# Patient Record
Sex: Female | Born: 1970
Health system: Southern US, Community
[De-identification: ages and names within clinical notes are randomized; demographics above are authoritative.]

## PROBLEM LIST (undated history)

## (undated) DIAGNOSIS — Z720 Tobacco use: Secondary | ICD-10-CM

## (undated) DIAGNOSIS — I639 Cerebral infarction, unspecified: Secondary | ICD-10-CM

## (undated) DIAGNOSIS — I509 Heart failure, unspecified: Secondary | ICD-10-CM

## (undated) DIAGNOSIS — I428 Other cardiomyopathies: Secondary | ICD-10-CM

## (undated) DIAGNOSIS — I10 Essential (primary) hypertension: Secondary | ICD-10-CM

## (undated) HISTORY — PX: CARDIAC CATHETERIZATION: SHX172

## (undated) HISTORY — PX: CAROTID STENT INSERTION: SHX5766

---

## 1997-08-20 ENCOUNTER — Other Ambulatory Visit: Admission: RE | Admit: 1997-08-20 | Discharge: 1997-08-20 | Payer: Self-pay | Admitting: Family Medicine

## 2001-09-28 ENCOUNTER — Emergency Department (HOSPITAL_COMMUNITY): Admission: EM | Admit: 2001-09-28 | Discharge: 2001-09-28 | Payer: Self-pay | Admitting: Emergency Medicine

## 2002-10-20 ENCOUNTER — Emergency Department (HOSPITAL_COMMUNITY): Admission: EM | Admit: 2002-10-20 | Discharge: 2002-10-20 | Payer: Self-pay | Admitting: Emergency Medicine

## 2002-10-20 ENCOUNTER — Encounter: Payer: Self-pay | Admitting: Emergency Medicine

## 2003-09-27 ENCOUNTER — Emergency Department (HOSPITAL_COMMUNITY): Admission: EM | Admit: 2003-09-27 | Discharge: 2003-09-27 | Payer: Self-pay

## 2003-09-30 ENCOUNTER — Emergency Department (HOSPITAL_COMMUNITY): Admission: EM | Admit: 2003-09-30 | Discharge: 2003-09-30 | Payer: Self-pay | Admitting: Family Medicine

## 2004-06-23 ENCOUNTER — Emergency Department (HOSPITAL_COMMUNITY): Admission: EM | Admit: 2004-06-23 | Discharge: 2004-06-23 | Payer: Self-pay | Admitting: Emergency Medicine

## 2004-09-04 ENCOUNTER — Emergency Department (HOSPITAL_COMMUNITY): Admission: EM | Admit: 2004-09-04 | Discharge: 2004-09-04 | Payer: Self-pay | Admitting: Family Medicine

## 2004-10-16 ENCOUNTER — Emergency Department (HOSPITAL_COMMUNITY): Admission: EM | Admit: 2004-10-16 | Discharge: 2004-10-16 | Payer: Self-pay | Admitting: Emergency Medicine

## 2005-04-02 ENCOUNTER — Emergency Department (HOSPITAL_COMMUNITY): Admission: EM | Admit: 2005-04-02 | Discharge: 2005-04-02 | Payer: Self-pay | Admitting: Emergency Medicine

## 2007-05-15 ENCOUNTER — Inpatient Hospital Stay (HOSPITAL_COMMUNITY): Admission: EM | Admit: 2007-05-15 | Discharge: 2007-05-22 | Payer: Self-pay | Admitting: Emergency Medicine

## 2007-05-17 ENCOUNTER — Ambulatory Visit: Payer: Self-pay | Admitting: Cardiology

## 2007-05-17 ENCOUNTER — Encounter (INDEPENDENT_AMBULATORY_CARE_PROVIDER_SITE_OTHER): Payer: Self-pay | Admitting: Internal Medicine

## 2007-07-08 ENCOUNTER — Inpatient Hospital Stay (HOSPITAL_COMMUNITY): Admission: EM | Admit: 2007-07-08 | Discharge: 2007-07-13 | Payer: Self-pay | Admitting: Emergency Medicine

## 2007-07-09 ENCOUNTER — Ambulatory Visit: Payer: Self-pay | Admitting: *Deleted

## 2007-07-09 ENCOUNTER — Encounter (INDEPENDENT_AMBULATORY_CARE_PROVIDER_SITE_OTHER): Payer: Self-pay | Admitting: Internal Medicine

## 2008-02-11 ENCOUNTER — Emergency Department (HOSPITAL_COMMUNITY): Admission: EM | Admit: 2008-02-11 | Discharge: 2008-02-12 | Payer: Self-pay | Admitting: Emergency Medicine

## 2008-02-15 ENCOUNTER — Inpatient Hospital Stay (HOSPITAL_COMMUNITY): Admission: EM | Admit: 2008-02-15 | Discharge: 2008-02-18 | Payer: Self-pay | Admitting: *Deleted

## 2008-02-16 ENCOUNTER — Encounter (INDEPENDENT_AMBULATORY_CARE_PROVIDER_SITE_OTHER): Payer: Self-pay | Admitting: Cardiology

## 2008-02-29 ENCOUNTER — Encounter: Payer: Self-pay | Admitting: Emergency Medicine

## 2008-02-29 ENCOUNTER — Ambulatory Visit: Payer: Self-pay | Admitting: *Deleted

## 2008-03-01 ENCOUNTER — Inpatient Hospital Stay (HOSPITAL_COMMUNITY): Admission: EM | Admit: 2008-03-01 | Discharge: 2008-03-06 | Payer: Self-pay | Admitting: Cardiology

## 2008-03-23 ENCOUNTER — Inpatient Hospital Stay (HOSPITAL_COMMUNITY): Admission: EM | Admit: 2008-03-23 | Discharge: 2008-03-26 | Payer: Self-pay | Admitting: Anesthesiology

## 2008-03-23 ENCOUNTER — Ambulatory Visit: Payer: Self-pay | Admitting: Cardiology

## 2008-04-03 ENCOUNTER — Inpatient Hospital Stay (HOSPITAL_COMMUNITY): Admission: EM | Admit: 2008-04-03 | Discharge: 2008-04-08 | Payer: Self-pay | Admitting: Emergency Medicine

## 2008-04-04 ENCOUNTER — Encounter (INDEPENDENT_AMBULATORY_CARE_PROVIDER_SITE_OTHER): Payer: Self-pay | Admitting: Internal Medicine

## 2008-05-03 ENCOUNTER — Ambulatory Visit: Payer: Self-pay | Admitting: Cardiovascular Disease

## 2008-05-03 ENCOUNTER — Inpatient Hospital Stay (HOSPITAL_COMMUNITY): Admission: EM | Admit: 2008-05-03 | Discharge: 2008-05-07 | Payer: Self-pay | Admitting: Emergency Medicine

## 2008-05-03 ENCOUNTER — Encounter: Payer: Self-pay | Admitting: Neurology

## 2008-05-04 ENCOUNTER — Encounter (INDEPENDENT_AMBULATORY_CARE_PROVIDER_SITE_OTHER): Payer: Self-pay | Admitting: Neurology

## 2008-05-05 ENCOUNTER — Encounter (INDEPENDENT_AMBULATORY_CARE_PROVIDER_SITE_OTHER): Payer: Self-pay | Admitting: Neurology

## 2008-05-18 DIAGNOSIS — I6350 Cerebral infarction due to unspecified occlusion or stenosis of unspecified cerebral artery: Secondary | ICD-10-CM | POA: Insufficient documentation

## 2008-05-19 ENCOUNTER — Inpatient Hospital Stay (HOSPITAL_COMMUNITY): Admission: EM | Admit: 2008-05-19 | Discharge: 2008-05-24 | Payer: Self-pay | Admitting: Emergency Medicine

## 2008-05-19 ENCOUNTER — Ambulatory Visit: Payer: Self-pay | Admitting: Vascular Surgery

## 2008-05-19 ENCOUNTER — Encounter (INDEPENDENT_AMBULATORY_CARE_PROVIDER_SITE_OTHER): Payer: Self-pay | Admitting: Internal Medicine

## 2008-07-04 ENCOUNTER — Emergency Department (HOSPITAL_COMMUNITY): Admission: EM | Admit: 2008-07-04 | Discharge: 2008-07-04 | Payer: Self-pay | Admitting: Family Medicine

## 2008-07-20 ENCOUNTER — Ambulatory Visit: Payer: Self-pay | Admitting: Critical Care Medicine

## 2008-07-20 ENCOUNTER — Inpatient Hospital Stay (HOSPITAL_COMMUNITY): Admission: EM | Admit: 2008-07-20 | Discharge: 2008-07-24 | Payer: Self-pay | Admitting: Emergency Medicine

## 2008-09-18 ENCOUNTER — Encounter (INDEPENDENT_AMBULATORY_CARE_PROVIDER_SITE_OTHER): Payer: Self-pay | Admitting: Nurse Practitioner

## 2008-09-25 ENCOUNTER — Encounter: Payer: Self-pay | Admitting: Cardiology

## 2008-09-25 ENCOUNTER — Ambulatory Visit: Payer: Self-pay | Admitting: Nurse Practitioner

## 2008-09-25 DIAGNOSIS — G4730 Sleep apnea, unspecified: Secondary | ICD-10-CM | POA: Insufficient documentation

## 2008-09-25 DIAGNOSIS — D649 Anemia, unspecified: Secondary | ICD-10-CM | POA: Insufficient documentation

## 2008-09-25 DIAGNOSIS — I509 Heart failure, unspecified: Secondary | ICD-10-CM | POA: Insufficient documentation

## 2008-09-25 DIAGNOSIS — I10 Essential (primary) hypertension: Secondary | ICD-10-CM | POA: Insufficient documentation

## 2008-09-25 DIAGNOSIS — F17200 Nicotine dependence, unspecified, uncomplicated: Secondary | ICD-10-CM | POA: Insufficient documentation

## 2008-09-25 DIAGNOSIS — G4700 Insomnia, unspecified: Secondary | ICD-10-CM | POA: Insufficient documentation

## 2008-09-25 LAB — CONVERTED CEMR LAB
BUN: 19 mg/dL (ref 6–23)
CO2: 27 meq/L (ref 19–32)
Calcium: 10 mg/dL (ref 8.4–10.5)
Creatinine, Ser: 1.47 mg/dL — ABNORMAL HIGH (ref 0.40–1.20)
Glucose, Bld: 77 mg/dL (ref 70–99)

## 2008-09-28 ENCOUNTER — Telehealth (INDEPENDENT_AMBULATORY_CARE_PROVIDER_SITE_OTHER): Payer: Self-pay | Admitting: Nurse Practitioner

## 2008-09-29 ENCOUNTER — Ambulatory Visit: Payer: Self-pay | Admitting: Cardiology

## 2008-09-29 ENCOUNTER — Encounter (INDEPENDENT_AMBULATORY_CARE_PROVIDER_SITE_OTHER): Payer: Self-pay | Admitting: Nurse Practitioner

## 2008-09-30 ENCOUNTER — Telehealth (INDEPENDENT_AMBULATORY_CARE_PROVIDER_SITE_OTHER): Payer: Self-pay | Admitting: Nurse Practitioner

## 2008-10-12 ENCOUNTER — Encounter (INDEPENDENT_AMBULATORY_CARE_PROVIDER_SITE_OTHER): Payer: Self-pay | Admitting: *Deleted

## 2008-10-13 ENCOUNTER — Encounter (INDEPENDENT_AMBULATORY_CARE_PROVIDER_SITE_OTHER): Payer: Self-pay | Admitting: Nurse Practitioner

## 2008-10-14 ENCOUNTER — Telehealth (INDEPENDENT_AMBULATORY_CARE_PROVIDER_SITE_OTHER): Payer: Self-pay | Admitting: Nurse Practitioner

## 2008-10-15 ENCOUNTER — Telehealth: Payer: Self-pay | Admitting: Cardiology

## 2008-10-15 LAB — CONVERTED CEMR LAB

## 2008-10-16 ENCOUNTER — Encounter (INDEPENDENT_AMBULATORY_CARE_PROVIDER_SITE_OTHER): Payer: Self-pay | Admitting: *Deleted

## 2008-10-16 ENCOUNTER — Encounter (INDEPENDENT_AMBULATORY_CARE_PROVIDER_SITE_OTHER): Payer: Self-pay | Admitting: Nurse Practitioner

## 2008-10-21 ENCOUNTER — Telehealth (INDEPENDENT_AMBULATORY_CARE_PROVIDER_SITE_OTHER): Payer: Self-pay | Admitting: Nurse Practitioner

## 2008-10-23 ENCOUNTER — Encounter (INDEPENDENT_AMBULATORY_CARE_PROVIDER_SITE_OTHER): Payer: Self-pay | Admitting: Nurse Practitioner

## 2008-10-28 ENCOUNTER — Encounter (INDEPENDENT_AMBULATORY_CARE_PROVIDER_SITE_OTHER): Payer: Self-pay | Admitting: Nurse Practitioner

## 2008-10-29 ENCOUNTER — Encounter (INDEPENDENT_AMBULATORY_CARE_PROVIDER_SITE_OTHER): Payer: Self-pay | Admitting: Nurse Practitioner

## 2008-10-30 ENCOUNTER — Encounter (INDEPENDENT_AMBULATORY_CARE_PROVIDER_SITE_OTHER): Payer: Self-pay | Admitting: Nurse Practitioner

## 2008-11-04 ENCOUNTER — Inpatient Hospital Stay (HOSPITAL_COMMUNITY): Admission: EM | Admit: 2008-11-04 | Discharge: 2008-11-08 | Payer: Self-pay | Admitting: Cardiology

## 2008-11-04 ENCOUNTER — Ambulatory Visit: Payer: Self-pay | Admitting: Cardiology

## 2008-11-04 ENCOUNTER — Encounter: Payer: Self-pay | Admitting: Physician Assistant

## 2008-11-05 ENCOUNTER — Encounter (INDEPENDENT_AMBULATORY_CARE_PROVIDER_SITE_OTHER): Payer: Self-pay | Admitting: Nurse Practitioner

## 2008-11-06 ENCOUNTER — Encounter: Payer: Self-pay | Admitting: Cardiology

## 2008-11-08 DIAGNOSIS — N183 Chronic kidney disease, stage 3 unspecified: Secondary | ICD-10-CM | POA: Insufficient documentation

## 2008-11-17 ENCOUNTER — Ambulatory Visit: Payer: Self-pay | Admitting: Nurse Practitioner

## 2008-11-17 ENCOUNTER — Encounter: Payer: Self-pay | Admitting: Cardiology

## 2008-11-17 DIAGNOSIS — R300 Dysuria: Secondary | ICD-10-CM | POA: Insufficient documentation

## 2008-11-17 DIAGNOSIS — M542 Cervicalgia: Secondary | ICD-10-CM | POA: Insufficient documentation

## 2008-11-18 ENCOUNTER — Encounter (INDEPENDENT_AMBULATORY_CARE_PROVIDER_SITE_OTHER): Payer: Self-pay | Admitting: Nurse Practitioner

## 2008-11-20 LAB — CONVERTED CEMR LAB
Calcium: 9.9 mg/dL (ref 8.4–10.5)
Creatinine, Ser: 1.37 mg/dL — ABNORMAL HIGH (ref 0.40–1.20)

## 2008-12-03 ENCOUNTER — Telehealth (INDEPENDENT_AMBULATORY_CARE_PROVIDER_SITE_OTHER): Payer: Self-pay | Admitting: Nurse Practitioner

## 2008-12-07 ENCOUNTER — Encounter (INDEPENDENT_AMBULATORY_CARE_PROVIDER_SITE_OTHER): Payer: Self-pay | Admitting: Nurse Practitioner

## 2008-12-08 ENCOUNTER — Encounter (INDEPENDENT_AMBULATORY_CARE_PROVIDER_SITE_OTHER): Payer: Self-pay | Admitting: Nurse Practitioner

## 2008-12-11 ENCOUNTER — Telehealth (INDEPENDENT_AMBULATORY_CARE_PROVIDER_SITE_OTHER): Payer: Self-pay | Admitting: Nurse Practitioner

## 2008-12-25 ENCOUNTER — Telehealth (INDEPENDENT_AMBULATORY_CARE_PROVIDER_SITE_OTHER): Payer: Self-pay | Admitting: *Deleted

## 2008-12-29 ENCOUNTER — Encounter (INDEPENDENT_AMBULATORY_CARE_PROVIDER_SITE_OTHER): Payer: Self-pay | Admitting: Nurse Practitioner

## 2009-01-03 ENCOUNTER — Encounter: Payer: Self-pay | Admitting: Cardiology

## 2009-01-19 ENCOUNTER — Encounter (INDEPENDENT_AMBULATORY_CARE_PROVIDER_SITE_OTHER): Payer: Self-pay | Admitting: Nurse Practitioner

## 2009-02-02 ENCOUNTER — Encounter (INDEPENDENT_AMBULATORY_CARE_PROVIDER_SITE_OTHER): Payer: Self-pay | Admitting: Nurse Practitioner

## 2009-02-24 ENCOUNTER — Ambulatory Visit: Payer: Self-pay | Admitting: Nurse Practitioner

## 2009-02-24 DIAGNOSIS — B009 Herpesviral infection, unspecified: Secondary | ICD-10-CM | POA: Insufficient documentation

## 2009-03-08 ENCOUNTER — Encounter (INDEPENDENT_AMBULATORY_CARE_PROVIDER_SITE_OTHER): Payer: Self-pay | Admitting: *Deleted

## 2009-04-12 ENCOUNTER — Telehealth (INDEPENDENT_AMBULATORY_CARE_PROVIDER_SITE_OTHER): Payer: Self-pay | Admitting: Nurse Practitioner

## 2009-04-20 ENCOUNTER — Encounter (INDEPENDENT_AMBULATORY_CARE_PROVIDER_SITE_OTHER): Payer: Self-pay | Admitting: *Deleted

## 2009-05-03 IMAGING — CR DG CHEST 2V
2 series · 2 of 2 positions shown · non-contrast
Comparison: 03/23/2008.

CLINICAL DATA: Cough, fever, shortness of breath and chest pain.

CHEST - 2 VIEW

[w chest pa]
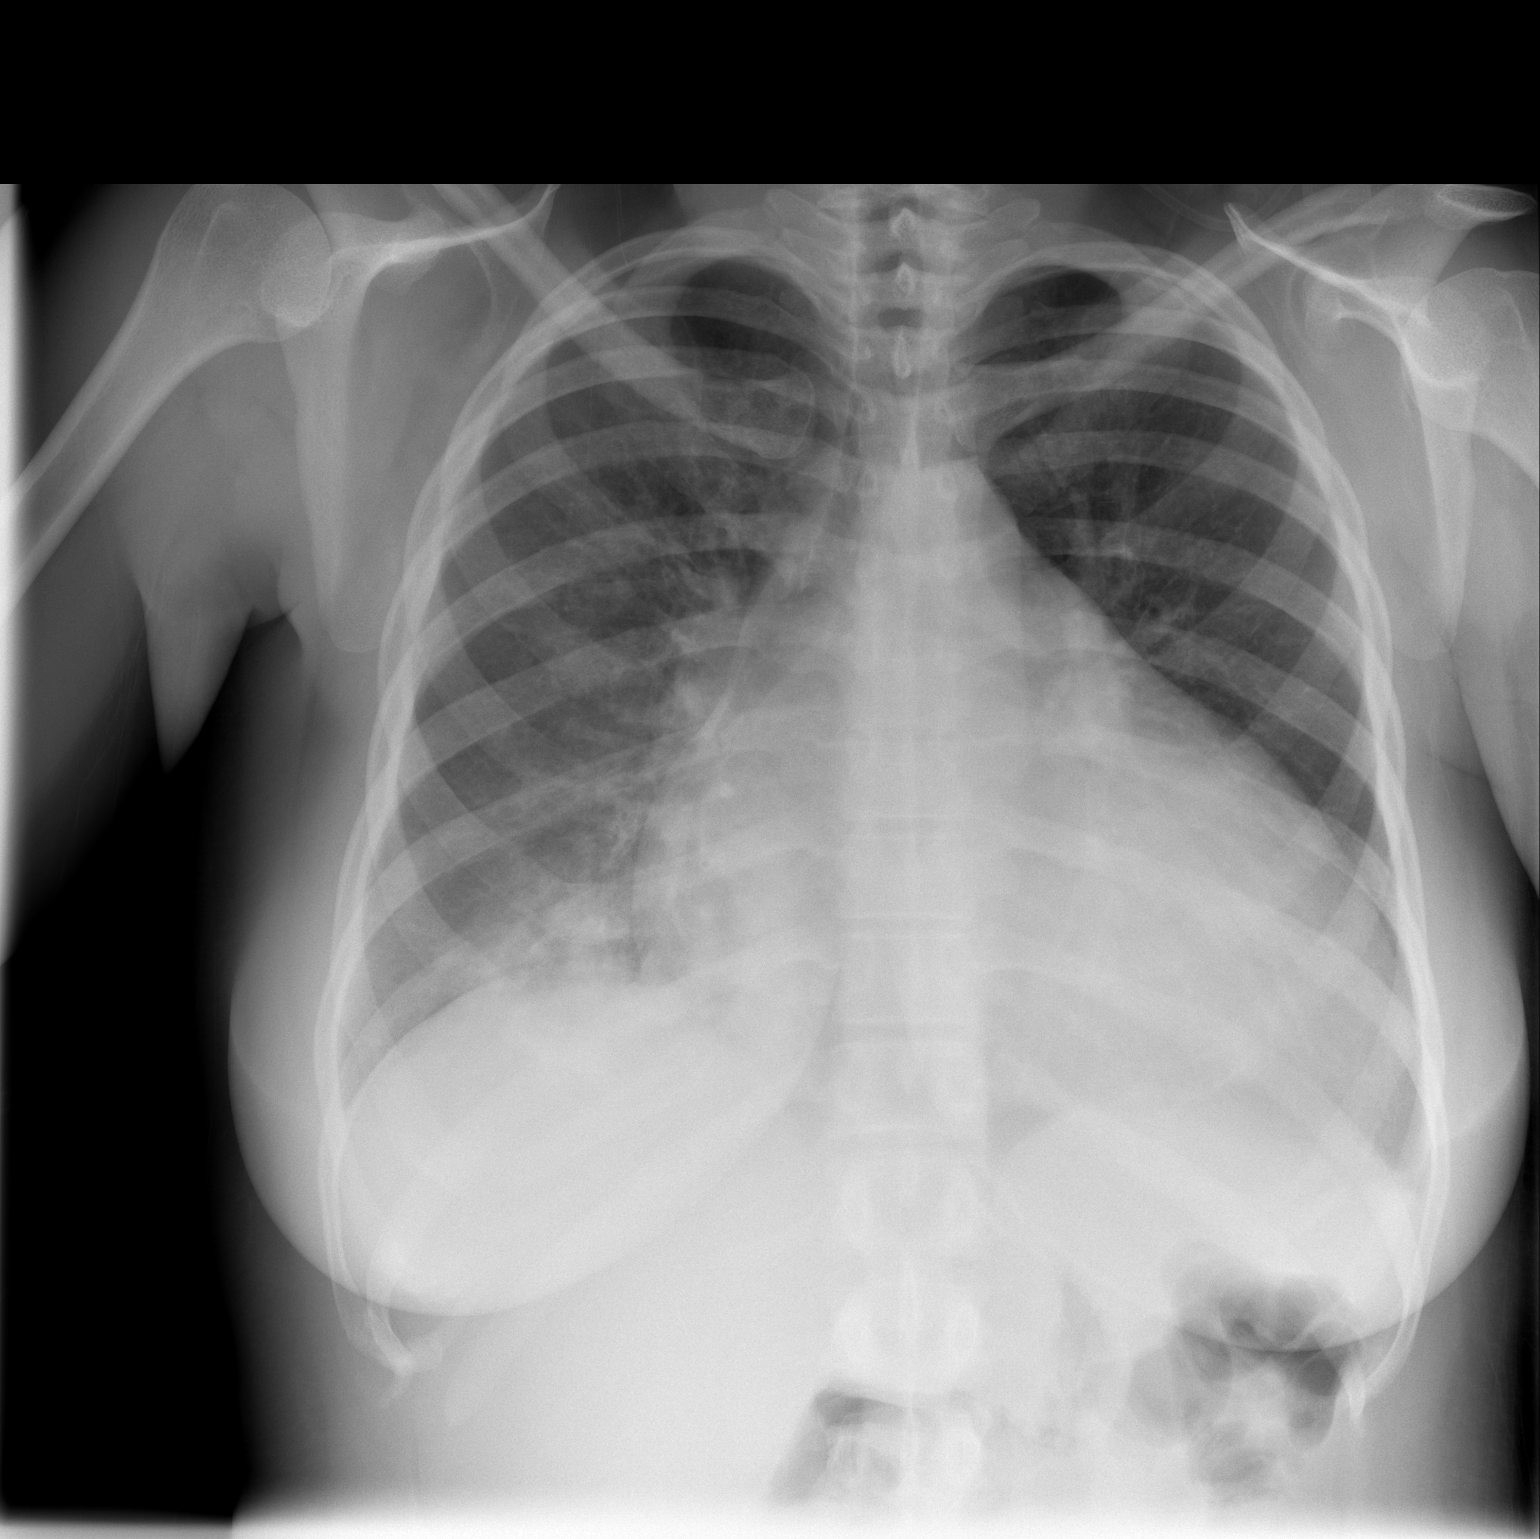

[w chest lat]
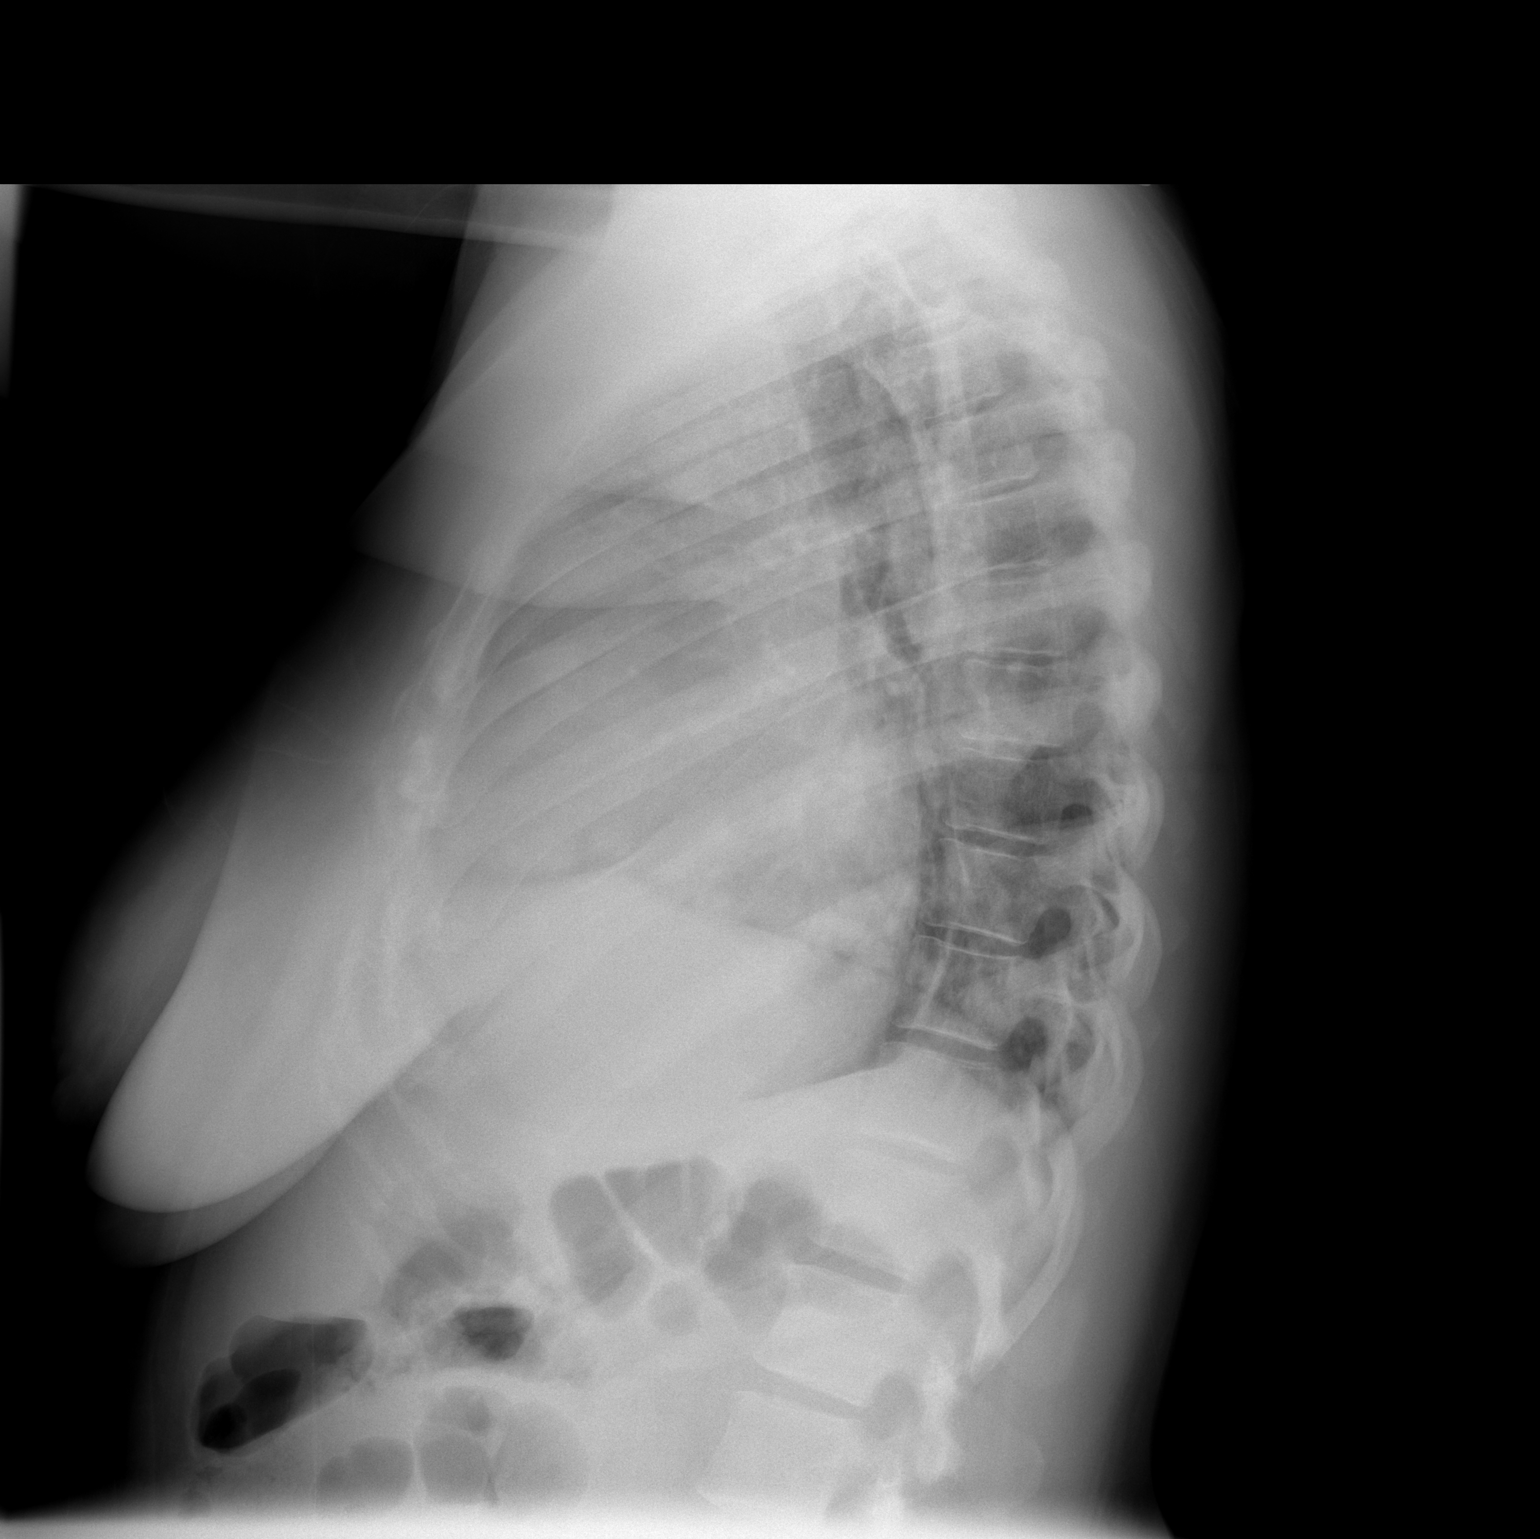

[2 of 2 positions shown; findings below may reference images not displayed]

FINDINGS: Stable enlargement of the cardiac silhouette.  Interval
patchy right lower lobe airspace opacity.  Unremarkable bones.
IMPRESSION: 1.  Right lower lobe pneumonia.
2.  Stable cardiomegaly.

## 2009-06-02 IMAGING — XA IR ANGIO/VERTEBRAL*R*
1 series · 16 of 24 positions shown · non-contrast
Comparison: CT scan of the brain of 05/03/2008.

CLINICAL DATA: Acute onset of left-sided weakness right gaze
preference

BILATERAL CAROTID ARTERIOGRAPHY, AND BILATERAL VERTEBRAL ARTERY
ANGIOGRAMS

[Series 1: run · 16 of 259 slices shown]
[im 1/259]
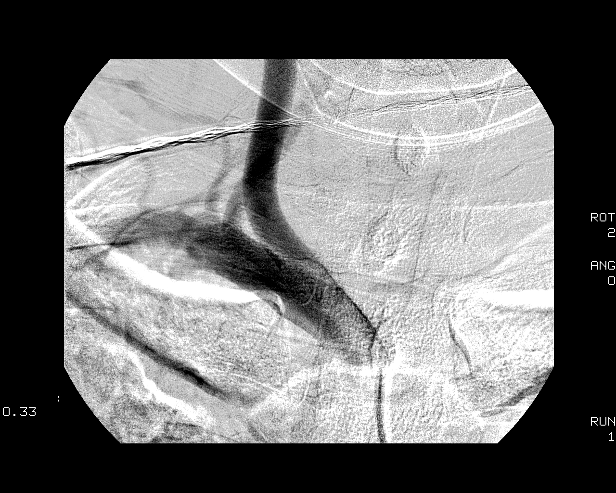
[im 23/259]
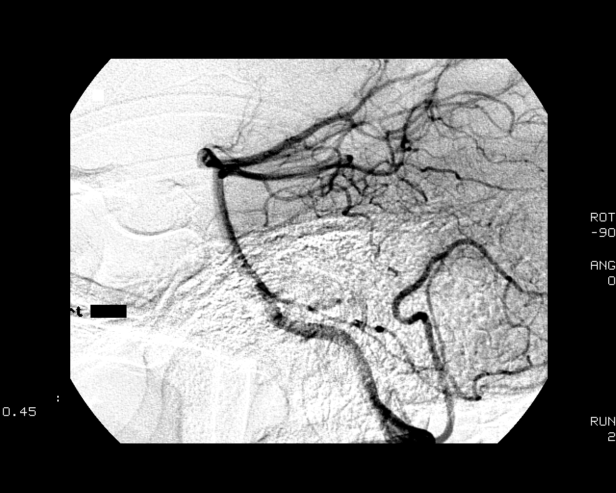
[im 34/259]
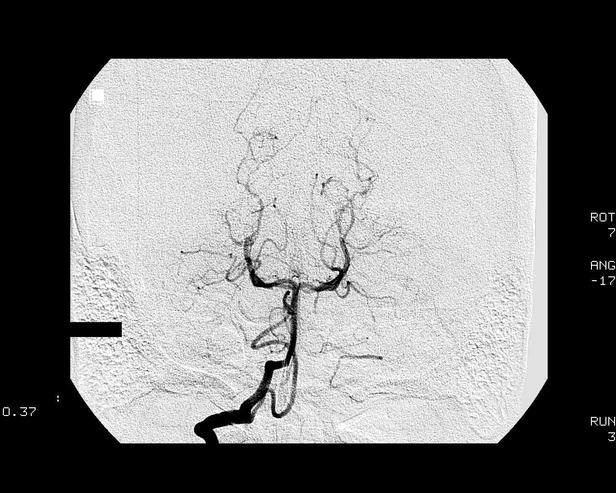
[im 57/259]
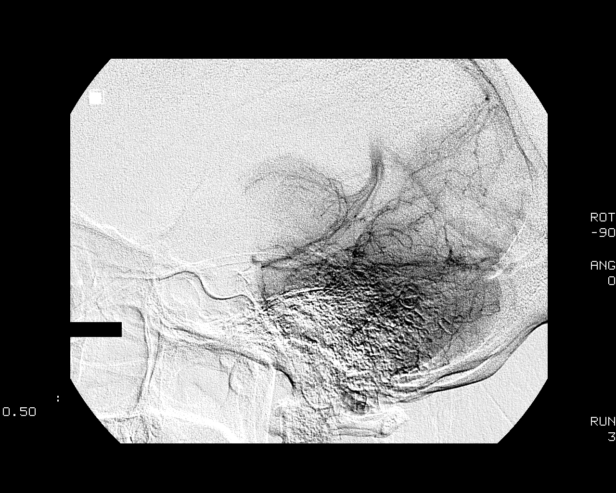
[im 68/259]
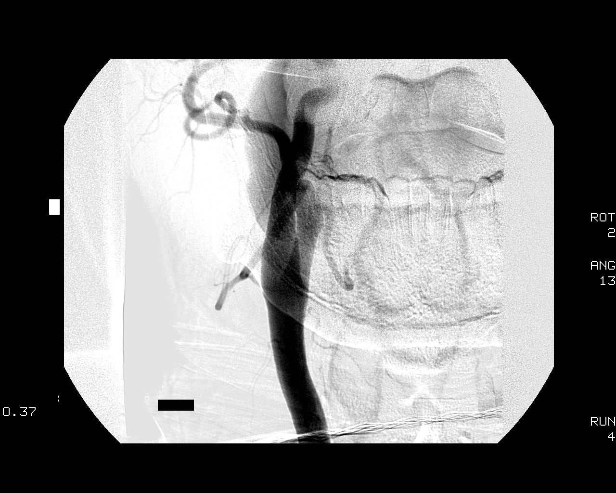
[im 90/259]
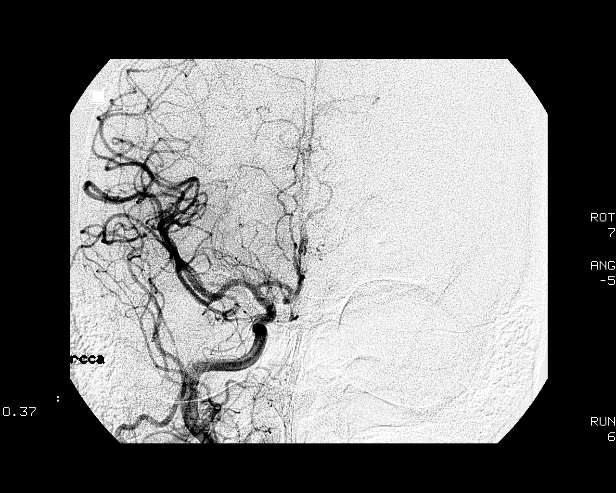
[im 101/259]
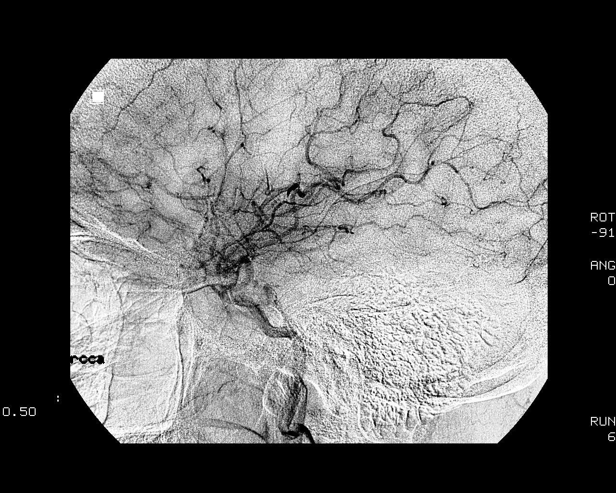
[im 124/259]
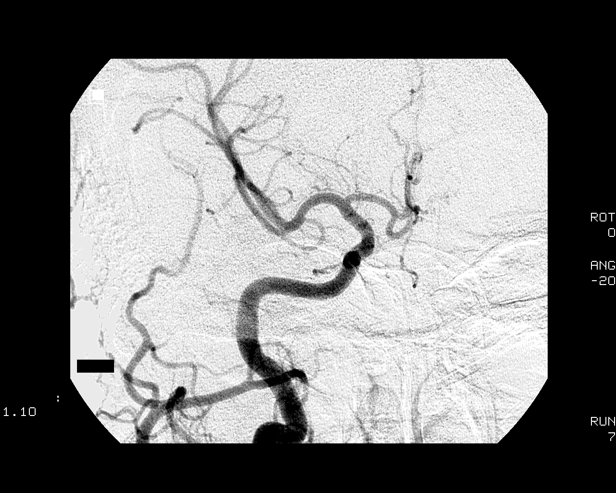
[im 135/259]
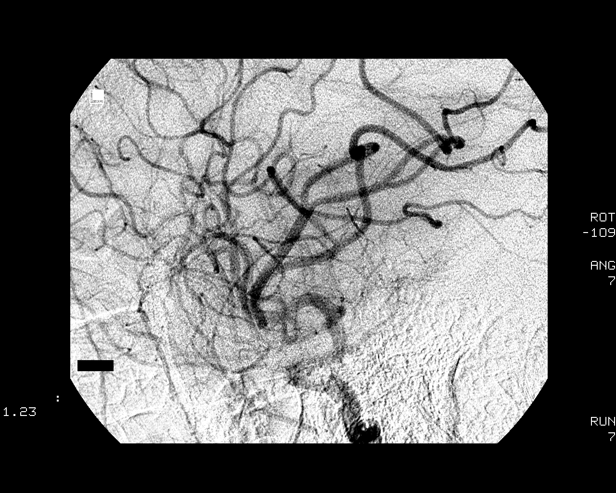
[im 158/259]
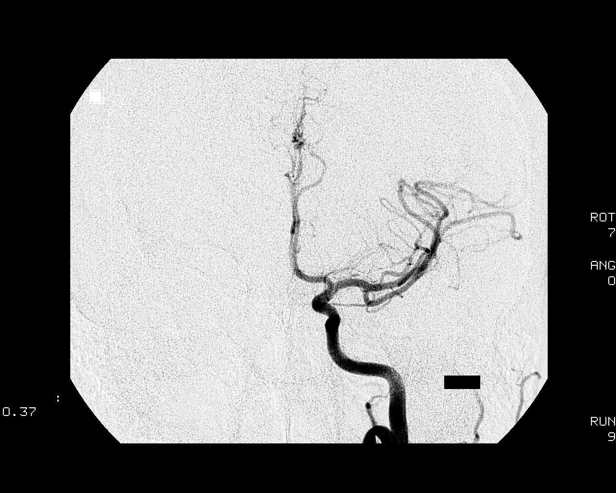
[im 169/259]
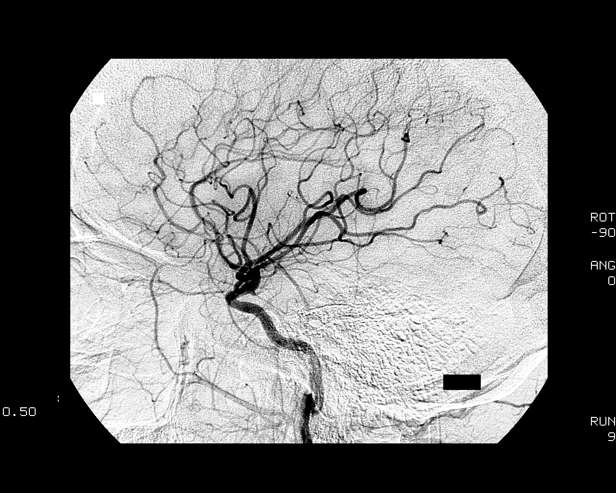
[im 191/259]
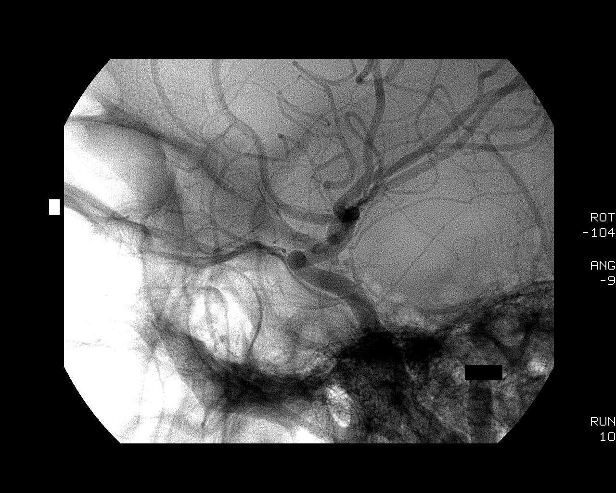
[im 202/259]
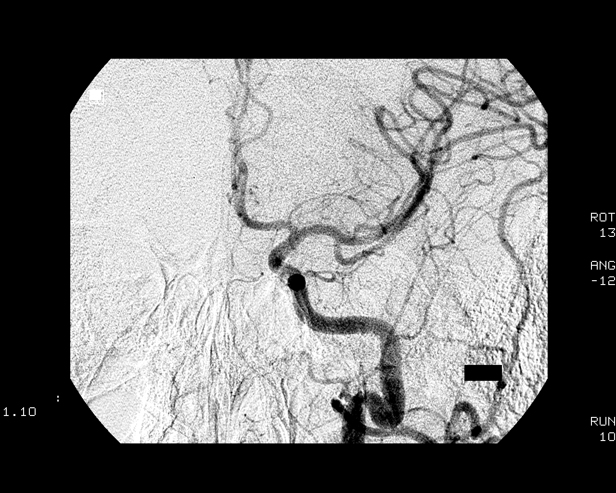
[im 225/259]
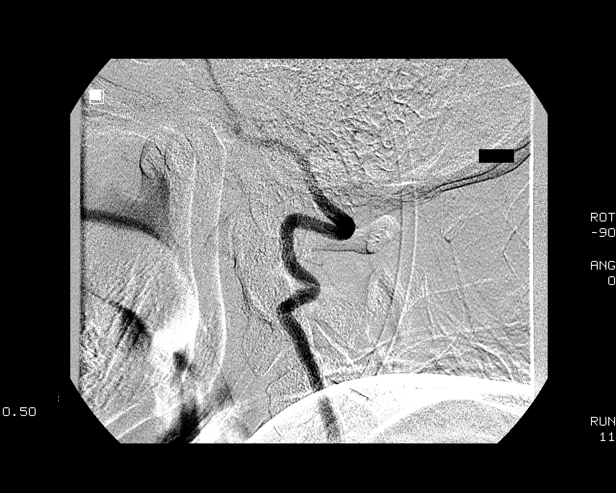
[im 236/259]
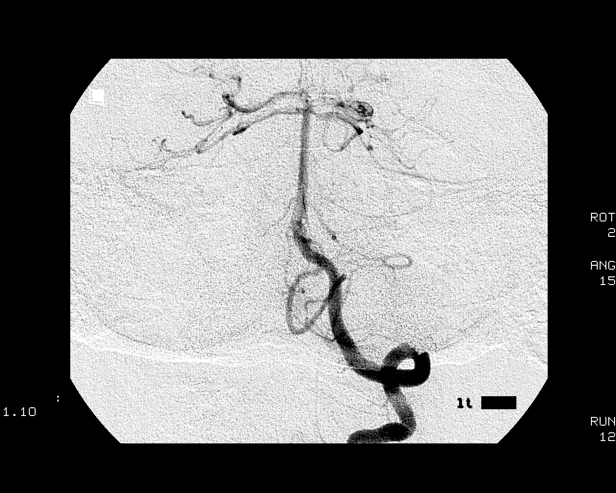
[im 259/259]
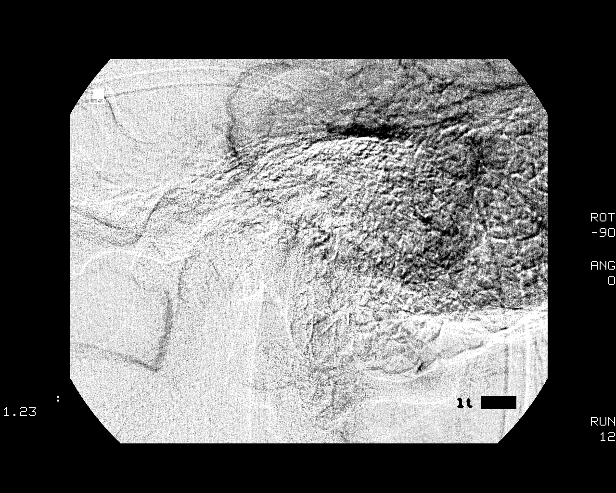

[16 of 24 positions shown; findings below may reference images not displayed]

Following a full explanation of the procedure along with the
potential associated complications, an informed witnessed consent
was obtained/

The right groin was prepped and draped in the usual sterile
fashion.  Thereafter using a modified Seldinger technique,
transfemoral access into the right common femoral artery was
obtained without difficulty.  Over a 0.035-inch guidewire, a 5-
French Pinnacle sheath was inserted.  Through this and also over a
0.035-inch guidewire, a 5-French JB1 catheter was advanced to the
aortic arch region and selectively positioned in the right
vertebral artery, the right common carotid artery, the left common
carotid artery and the left vertebral artery.

There were no acute complications.  The patient tolerated the
procedure well.

Medications:  IV Cardene.
FINDINGS: The right vertebral artery origin is normal.  The vessel
is seen to opacify normally to the cranial skull base.

Normal opacification is seen of the right posterior inferior
cerebellar artery and the right vertebrobasilar junction.

The opacified portion of the basilar artery, the posterior cerebral
arteries, the superior cerebellar arteries and the anterior
inferior cerebellar arteries is normal into the capillary and the
venous phases.

Unopacified blood is seen in the basilar artery from the
contralateral vertebral artery.

The right common carotid arteriogram demonstrates the right
external carotid artery and its major branches to be normal.

The right internal carotid artery at the bulb to the cranial skull
base is normally opacified.

The petrous, the cavernous and the supraclinoid segments are within
normal limits.

The right middle and the right anterior cerebral arteries are seen
to opacify normally proximally.  There is a truncated superior
division of the right middle cerebral artery in the M2/M3 region.

An associated small focal area of hypoperfusion is seen in the
right mid peri-insular subcortical white matter.

The subsequent capillary and the venous phases are otherwise within
normal limits.

An incidental small infundibulum is seen at the origin of the right
posterior communicating artery.

The left common carotid arteriogram demonstrates the left external
carotid artery and its major branches to be normal.

The left internal carotid artery at the bulb to the level of C1 is
normal.

Just proximal to the cranial skull base there is a focal area of
50% stenosis at the cervical-petrous junction.  Distal to this, the
vessel is seen to opacify normally in the petrous, the cavernous
and the supraclinoid segments.

Again demonstrated is a small infundibulum at the origin of the
left posterior communicating artery.

The left middle and the left anterior cerebral arteries are seen to
opacify normally into the capillary and the venous phases.  Cross
opacification via the ACOM of the right ACA distal to the A2
segment is noted.

The left vertebral artery origin is normal.  The vessel is seen to
opacify normally to the cranial skull base.

There is normal opacification of the left posterior inferior
cerebellar artery and the left vertebrobasilar junction.

The basilar artery, the opacified portion of the posterior cerebral
arteries, the superior cerebellar arteries and the anterior
inferior cerebellar arteries is normally into the capillary and the
venous phases.
IMPRESSION: 1.  Angiographic occlusion of the superior division of the right
middle cerebral artery in the M2/M3 region.
2.  50% stenosis of the left internal carotid artery at the
cervical-petrous junction.

## 2009-06-02 IMAGING — CR DG CHEST 2V
2 series · 2 of 2 positions shown · non-contrast
Comparison: 04/05/2008.

CLINICAL DATA: Shortness of breath and anxiety.

CHEST - 2 VIEW

[w chest pa]
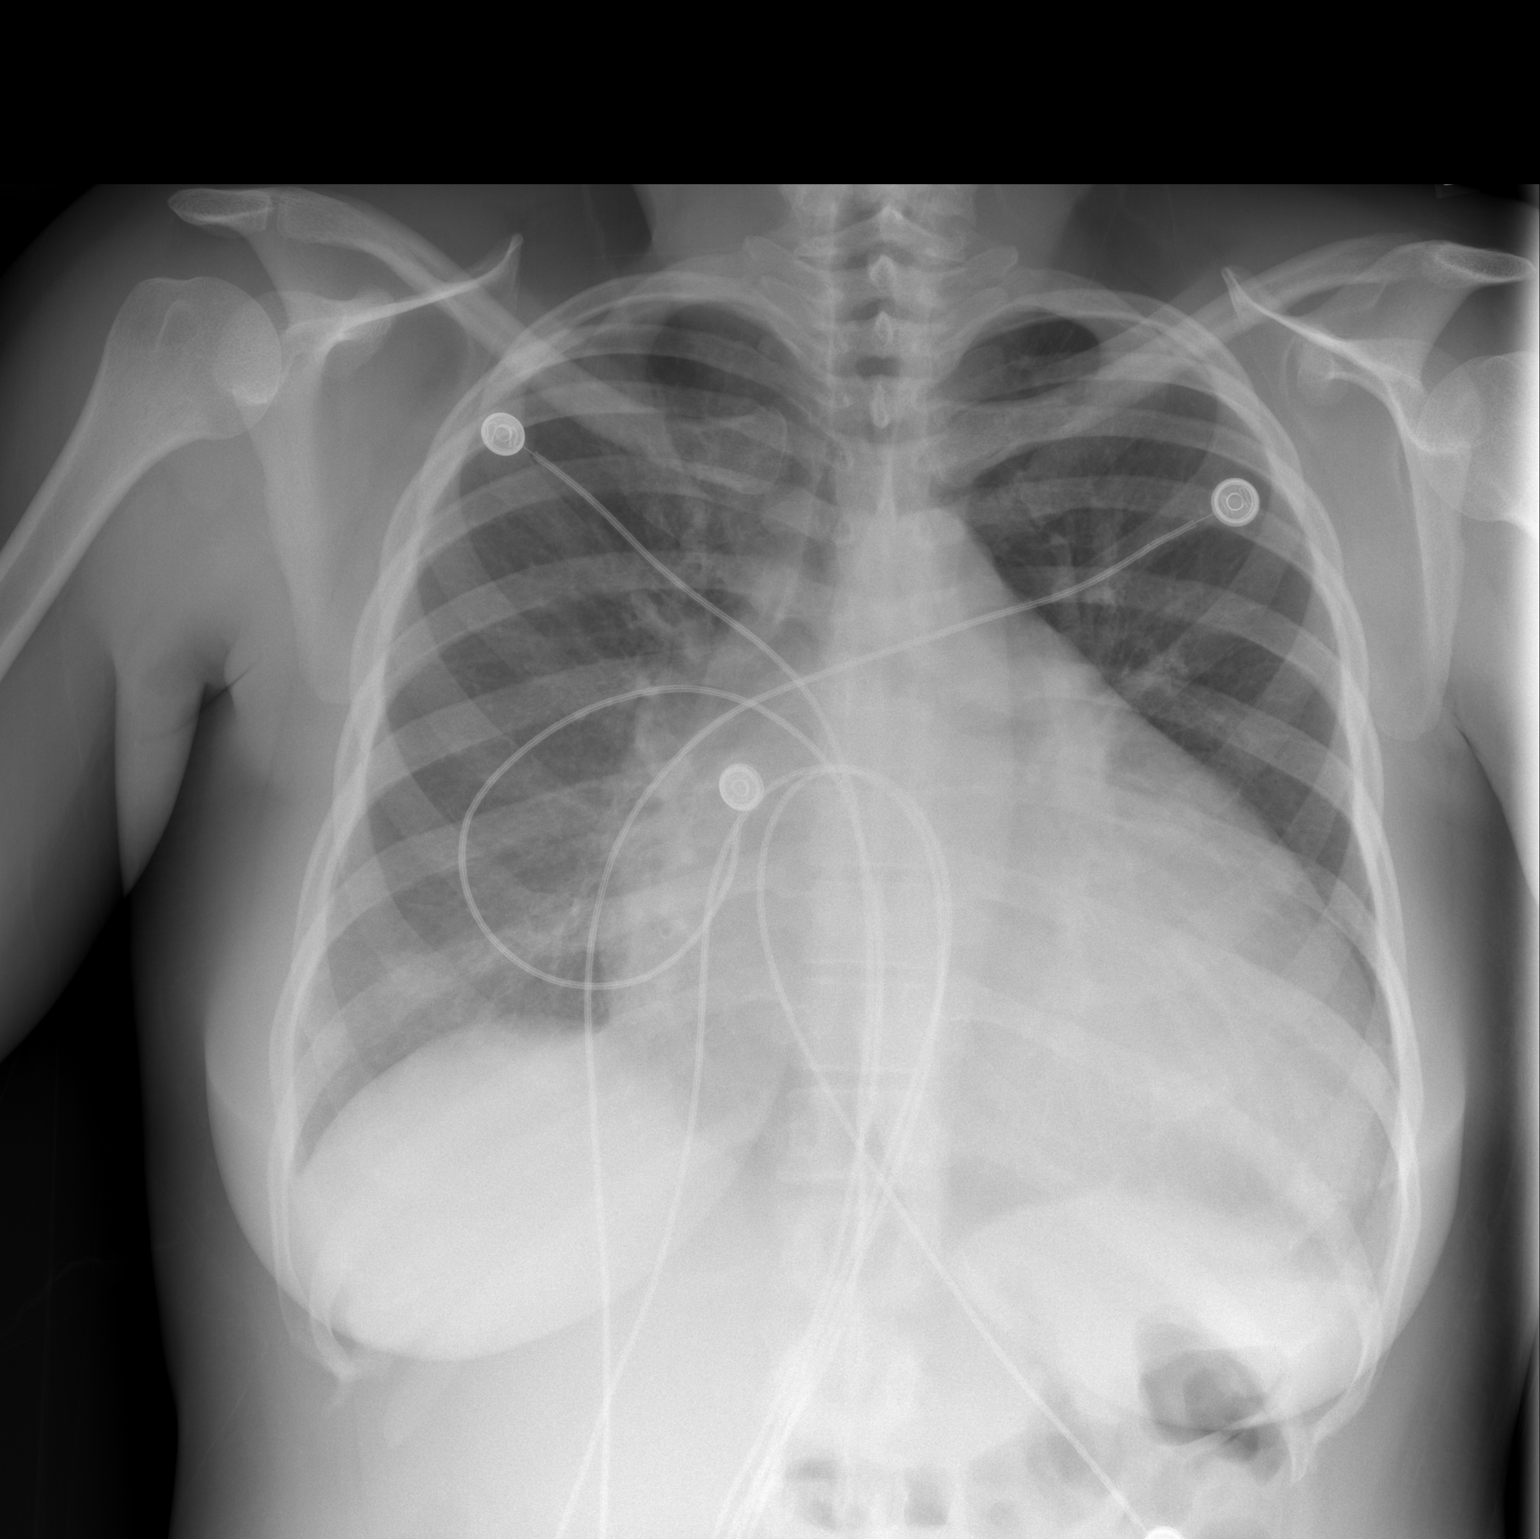

[w chest lat *]
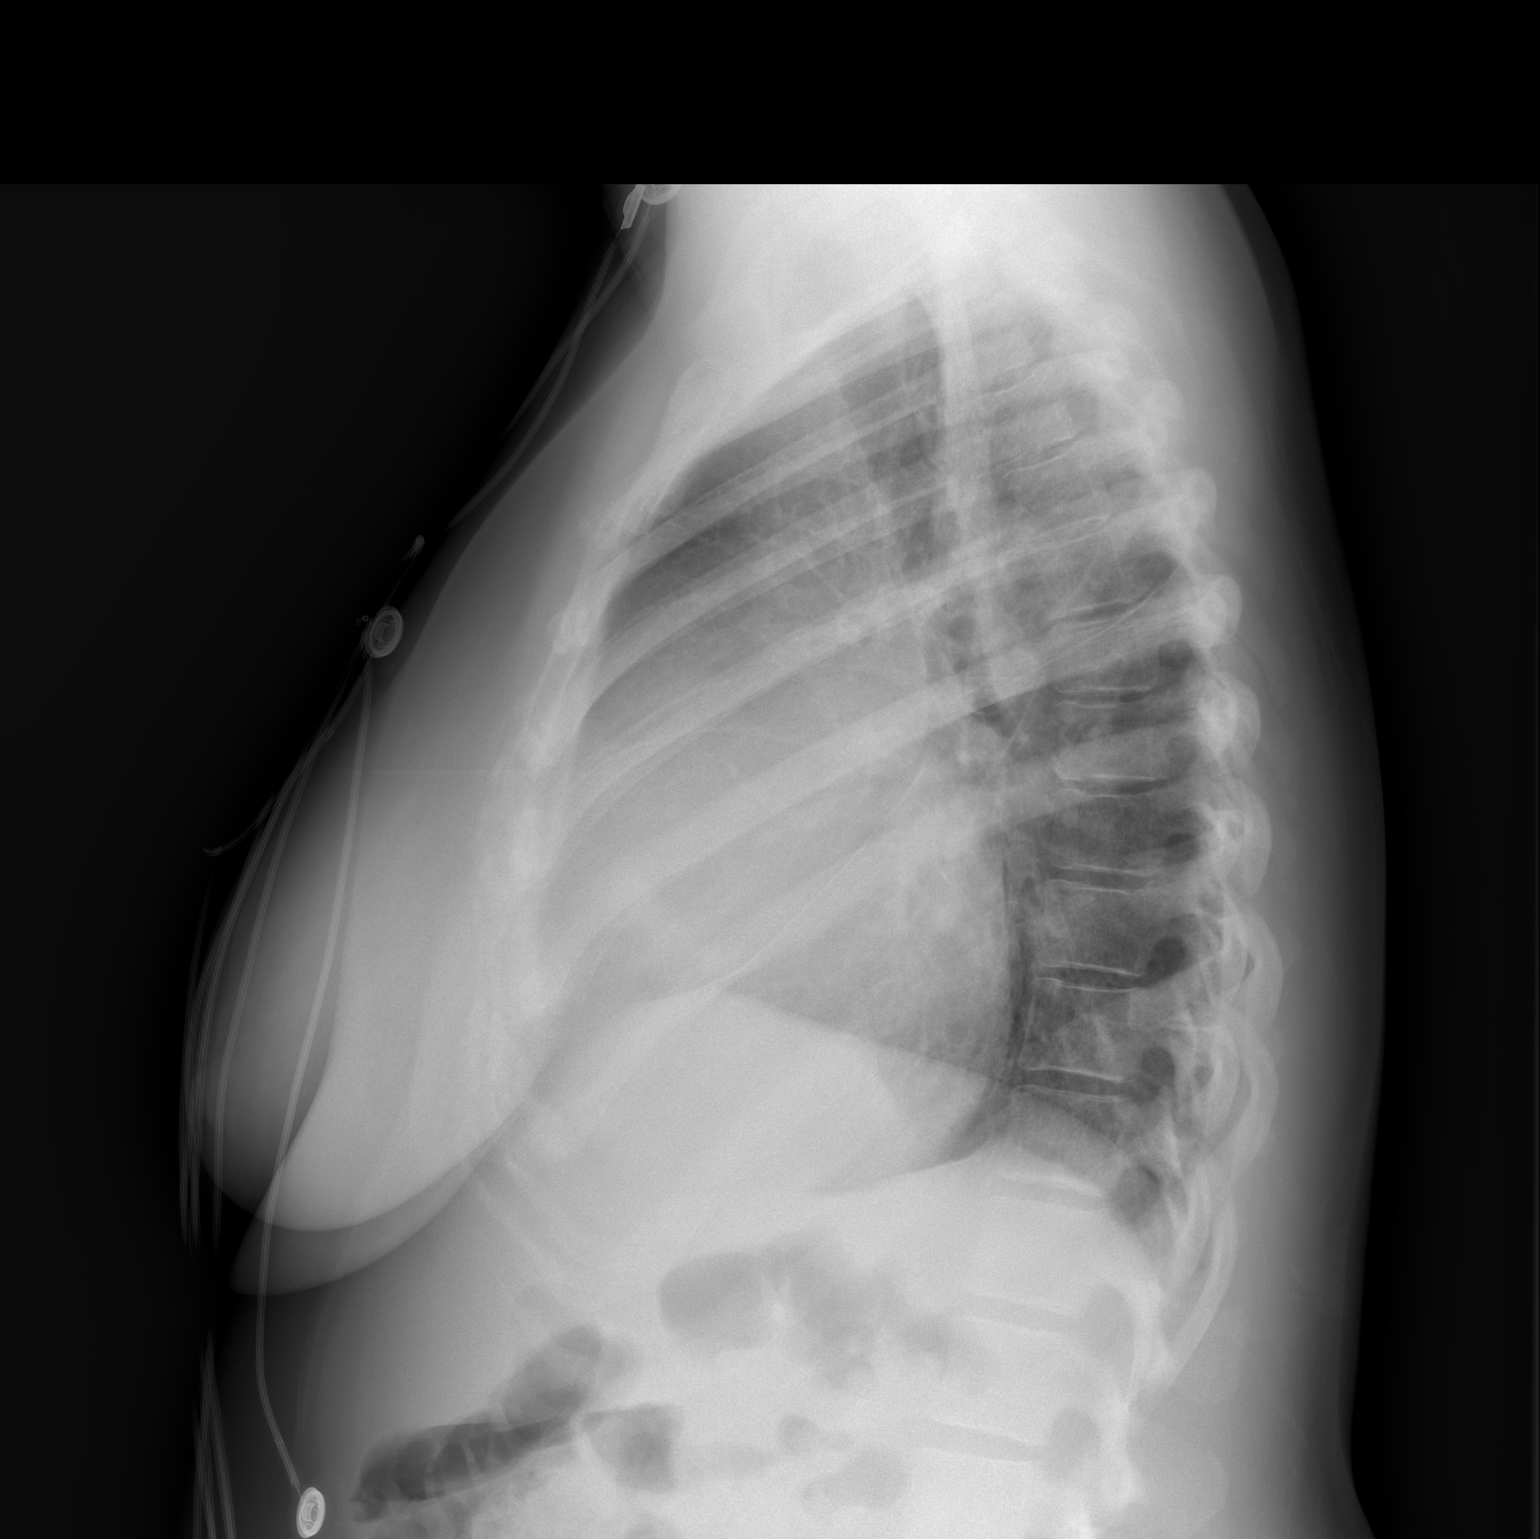

[2 of 2 positions shown; findings below may reference images not displayed]

FINDINGS: Marked cardiac enlargement is again noted.  There is
chronic vascular congestion with resolution of bibasilar air space
opacities noted previously.  There is no edema or significant
pleural effusion.  No acute osseous findings are seen.
IMPRESSION: Interval improved aeration of the lungs.  Cardiomegaly and chronic
vascular congestion.

## 2009-07-23 ENCOUNTER — Telehealth (INDEPENDENT_AMBULATORY_CARE_PROVIDER_SITE_OTHER): Payer: Self-pay | Admitting: *Deleted

## 2010-03-15 ENCOUNTER — Ambulatory Visit: Payer: Self-pay | Admitting: Nurse Practitioner

## 2010-03-15 DIAGNOSIS — R5381 Other malaise: Secondary | ICD-10-CM

## 2010-03-15 DIAGNOSIS — E669 Obesity, unspecified: Secondary | ICD-10-CM | POA: Insufficient documentation

## 2010-03-15 DIAGNOSIS — R5383 Other fatigue: Secondary | ICD-10-CM | POA: Insufficient documentation

## 2010-03-15 LAB — CONVERTED CEMR LAB
Bilirubin Urine: NEGATIVE
Glucose, Urine, Semiquant: NEGATIVE
Specific Gravity, Urine: 1.01
Urobilinogen, UA: 0.2

## 2010-05-19 NOTE — Progress Notes (Signed)
  Faxed all Cardiac over to [facility] to fax [phone] [facility]  July 23, 2009 9:39 AM

## 2010-05-19 NOTE — Assessment & Plan Note (Signed)
Summary: HTN/CHF   Vital Signs:  Patient profile:   40 year old female Menstrual status:  regular LMP:     03/06/2010 Weight:      216.2 pounds BMI:     37.24 BSA:     2.02 O2 Sat:      97 % on Room air Temp:     98.6 degrees F oral Pulse rate:   98 / minute Pulse rhythm:   regular Resp:     18 per minute BP sitting:   198 / 120  (left arm) Cuff size:   regular  Vitals Entered By: Gaylyn Cheers RN (March 15, 2010 9:25 AM)  Nutrition Counseling: Patient's BMI is greater than 25 and therefore counseled on weight management options.  O2 Flow:  Room air  Serial Vital Signs/Assessments:  Time      Position  BP       Pulse  Resp  Temp     By                     190/120                        Gaylyn Cheers RN 11:16 AM            180/140                        Steward Drone Craddock                                PEF    PreRx  PostRx Time      O2 Sat  O2 Type     L/min  L/min  L/min   By 9:40 AM   97  %   Room air                          [facility] RN  Comments: 9:40 AM 220, 230, 220 By: Gaylyn Cheers RN   CC: fatigue, generalized aching mostly @ night, SOB, dry cough unable to expectorate 4 days, needs refill for all meds; sore on rectal area., Hypertension Management, CHF Management Is Patient Diabetic? No Pain Assessment Patient in pain? no       Does patient need assistance? Functional Status Self care Ambulation Normal LMP (date): 03/06/2010     Menstrual Status regular Enter LMP: 03/06/2010 Last PAP Result done per pt   Primary Care [facility]:  Shirlean Schlein FNP  CC:  fatigue, generalized aching mostly @ night, SOB, dry cough unable to expectorate 4 days, needs refill for all meds; sore on rectal area., Hypertension Management, and CHF Management.  History of Present Illness:  Pt into the office after having not been here for the past year. She report that in April 2011 she was seen at cardiology for cardiac cath which was normal.    CHF  History:      She complains of orthopnea.  Daily weights are not being checked.  She understands fluid management and sodium restriction but is not following this regimen.  The patient expresses lack of understanding of the treatment plan and her medications.  She admits to lack of compliance with her medications.  ADL's are not being done without symptoms or restrictions.  The patient is enrolled in (or has completed) the CHF Eduction Program.    Hypertension  History:      She complains of dyspnea with exertion and orthopnea, but denies headache, chest pain, and palpitations.  pt has completed her supply of meds.  she is no longer on medicaid and is having trouble getting her meds.        Positive major cardiovascular risk factors include hypertension and current tobacco user.  Negative major cardiovascular risk factors include female age less than 74 years old and no history of diabetes or hyperlipidemia.        Positive history for target organ damage include cardiac end organ damage (either CHF or LVH) and prior stroke (or TIA).  Further assessment for target organ damage reveals no history of peripheral vascular disease, renal insufficiency, or hypertensive retinopathy.      Habits & Providers  Alcohol-Tobacco-Diet     Alcohol drinks/day: <1     Alcohol Counseling: not indicated; patient does not drink     Tobacco Status: current     Tobacco Counseling: to quit use of tobacco products     Cigarette Packs/Day: <0.25     Year Started: age 57  Exercise-Depression-Behavior     Does Patient Exercise: no     Depression Counseling: further diagnostic testing and/or other treatment is indicated     Drug Use: never  Current Medications (verified): 1)  Spironolactone 25 Mg Tabs (Spironolactone) .... Take 1 Tab Daily 2)  Furosemide 40 Mg Tabs (Furosemide) .... One Tablet By Mouth Daily 3)  Enalapril Maleate 20 Mg Tabs (Enalapril Maleate) .... One Tablet By Mouth Two Times A Day 4)  Folic Acid  1 Mg Tabs (Folic Acid) .... One Tablet By Mouth Daily 5)  Iron Supplement 325 (65 Fe) Mg Tabs (Ferrous Sulfate) .... One Tablet By Mouth Two Times A Day 6)  Trazodone Hcl 50 Mg Tabs (Trazodone Hcl) .Marland Kitchen.. 1-2 Tablets By Mouth Nightly As Needed For Sleep 7)  Carvedilol 12.5 Mg Tabs (Carvedilol) .... One By Mouth Two Times A Day  Allergies (verified): 1)  ! Sulfa  Review of Systems General:  Complains of fatigue. ENT:  Denies nasal congestion. CV:  Denies chest pain or discomfort. Resp:  Complains of cough and wheezing. GI:  Denies abdominal pain, nausea, and vomiting. GU:  sore on buttocks.  Physical Exam  General:  alert.   Head:  normocephalic.   Lungs:  audible wheezing some bases in crackles Heart:  normal rate and regular rhythm.   Abdomen:  normal bowel sounds.   Msk:  normal ROM.   Neurologic:  alert & oriented X3.     Impression & Recommendations:  Problem # 1:  HYPERTENSION, BENIGN ESSENTIAL (ICD-401.1) BP elevated today  clonidine given today in office Her updated medication list for this problem includes:    Spironolactone 25 Mg Tabs (Spironolactone) .Marland Kitchen... Take 1 tab daily    Furosemide 40 Mg Tabs (Furosemide) ..... One tablet by mouth daily    Enalapril Maleate 20 Mg Tabs (Enalapril maleate) ..... One tablet by mouth two times a day    Carvedilol 12.5 Mg Tabs (Carvedilol) ..... One by mouth two times a day  Orders: Rapid HIV  (92370) UA Dipstick w/o Micro (manual) (16109)  Problem # 2:  CONGESTIVE HEART FAILURE (ICD-428.0) lasix given today in office pt advised to restart meds Her updated medication list for this problem includes:    Spironolactone 25 Mg Tabs (Spironolactone) .Marland Kitchen... Take 1 tab daily    Furosemide 40 Mg Tabs (Furosemide) ..... One tablet by mouth daily  Enalapril Maleate 20 Mg Tabs (Enalapril maleate) ..... One tablet by mouth two times a day    Carvedilol 12.5 Mg Tabs (Carvedilol) ..... One by mouth two times a day  Orders: Pulse  Oximetry (single measurment) (94760) Peak Flow Rate (94150)  Problem # 3:  OBESITY (ICD-278.00) weight is up most likely secondary to CHF  Problem # 4:  HSV (ICD-054.9) will start anti-viral pt is requesting acyvlovir cream - advised pt this is only helpful for mouth sores  Problem # 5:  RENAL DISEASE, CHRONIC, STAGE III (ICD-585.3) will check labs today Orders: Furosemide- Lasix Injection (J1940) Admin of Therapeutic Inj  intramuscular or subcutaneous (16109) Clonidine 0.1mg  tab (EMRORAL) unable to get labs today - pt advised to return to office tomorrow  Complete Medication List: 1)  Spironolactone 25 Mg Tabs (Spironolactone) .... Take 1 tab daily 2)  Furosemide 40 Mg Tabs (Furosemide) .... One tablet by mouth daily 3)  Enalapril Maleate 20 Mg Tabs (Enalapril maleate) .... One tablet by mouth two times a day 4)  Folic Acid 1 Mg Tabs (Folic acid) .... One tablet by mouth daily 5)  Iron Supplement 325 (65 Fe) Mg Tabs (Ferrous sulfate) .... One tablet by mouth two times a day 6)  Trazodone Hcl 50 Mg Tabs (Trazodone hcl) .Marland Kitchen.. 1-2 tablets by mouth nightly as needed for sleep 7)  Carvedilol 12.5 Mg Tabs (Carvedilol) .... One by mouth two times a day 8)  Acyclovir 400 Mg Tabs (Acyclovir) .... One tablet by mouth five times per day for outbreak  CHF Assessment/Plan:      The patient's current weight is 216.2 pounds.  Her previous weight was 196.7 pounds.  Her last cardiac ejection fraction was 25 % on 04/27/2008.  Her ACE-I goal is enalopril (Vasotec) 20 mg qd.  The ACE-I goal dose has been met.  The beta-blocker goal is carvedilol (Coreg) 25 mg bid.    Hypertension Assessment/Plan:      The patient's hypertensive risk group is category C: Target organ damage and/or diabetes.  Today's blood pressure is 198/120.  Her blood pressure goal is < 140/90.  Patient Instructions: 1)  You have some extra fluid around your lungs and heart which is causing you to wheeze 2)  You have been given an  injection of lasix today in office 3)  you should restart on the rest of your medications as ordered 4)  Blood pressure - very high today. 5)  You have been given clonidine in office but you need to get your other medications 6)  you have been given the flu vaccine today 7)  You will be notified of your lab results 8)  Outbreak - cream is less effective for vaginal outbreaks.  Taking the pill is more effective as this will get into your system to help stop the replication of the virus 9)  Follow up for triage visit in 1 week for assessement of breathing and blood pressure check. Goal 140/90 10)  Bring all medications with you Prescriptions: ACYCLOVIR 400 MG TABS (ACYCLOVIR) One tablet by mouth five times per day for outbreak  #25 x 0   Entered and Authorized by:   Lehman Prom FNP   Signed by:   Lehman Prom FNP on 03/15/2010   Method used:   Print then Give to Patient   RxID:   [id number] TRAZODONE HCL 50 MG TABS (TRAZODONE HCL) 1-2 tablets by mouth nightly as needed for sleep  #60 x 1   Entered and Authorized by:  Lehman Prom FNP   Signed by:   Lehman Prom FNP on 03/15/2010   Method used:   Print then Give to Patient   RxID:   [id number] CARVEDILOL 12.5 MG TABS (CARVEDILOL) one by mouth two times a day  #60 x 5   Entered and Authorized by:   Lehman Prom FNP   Signed by:   Lehman Prom FNP on 03/15/2010   Method used:   Print then Give to Patient   RxID:   [id number] ENALAPRIL MALEATE 20 MG TABS (ENALAPRIL MALEATE) One tablet by mouth two times a day  #60 x 5   Entered and Authorized by:   Lehman Prom FNP   Signed by:   Lehman Prom FNP on 03/15/2010   Method used:   Print then Give to Patient   RxID:   [id number] FUROSEMIDE 40 MG TABS (FUROSEMIDE) One tablet by mouth daily  #30 x 5   Entered and Authorized by:   Lehman Prom FNP   Signed by:   Lehman Prom FNP on 03/15/2010   Method used:   Print then Give to Patient    RxID:   [id number] SPIRONOLACTONE 25 MG TABS (SPIRONOLACTONE) Take 1 tab daily  #30 x 5   Entered and Authorized by:   Lehman Prom FNP   Signed by:   Lehman Prom FNP on 03/15/2010   Method used:   Print then Give to Patient   RxID:   [id number]    Medication Administration  Injection # 1:    Medication: Furosemide- Lasix Injection    Diagnosis: RENAL DISEASE, CHRONIC, STAGE III (ICD-585.3)    Route: IM    Site: RUOQ gluteus    Exp Date: 02/16/2011    Lot #: [id number]    Mfr: [email or web address]    Patient tolerated injection without complications    Given by: Levon Hedger (March 15, 2010 10:55 AM)  Medication # 1:    Medication: Clonidine 0.1mg  tab    Diagnosis: RENAL DISEASE, CHRONIC, STAGE III (ICD-585.3)    Dose: 1 tablet    Route: po    Exp Date: 03/2010    Lot #: [id number]    Mfr: [email or web address]    Patient tolerated medication without complications    Given by: Levon Hedger (March 15, 2010 10:54 AM)  Orders Added: 1)  Est. Patient Level IV [16606] 2)  Rapid HIV  [92370] 3)  Furosemide- Lasix Injection [J1940] 4)  Admin of Therapeutic Inj  intramuscular or subcutaneous [96372] 5)  Clonidine 0.1mg  tab [EMRORAL] 6)  Pulse Oximetry (single measurment) [94760] 7)  Peak Flow Rate [94150] 8)  UA Dipstick w/o Micro (manual) [81002]    Prevention & Chronic Care Immunizations   Influenza vaccine: historical per pt  (02/15/2009)    Tetanus booster: 02/24/2009: Tdap    Pneumococcal vaccine: Not documented  Other Screening   Pap smear: done per pt  (10/15/2008)   Smoking status: current  (03/15/2010)  Lipids   Total Cholesterol: Not documented   LDL: Not documented   LDL Direct: Not documented   HDL: Not documented   Triglycerides: Not documented  Hypertension   Last Blood Pressure: 198 / 120  (03/15/2010)   Serum creatinine: 1.37  (11/17/2008)   Serum potassium 4.9  (11/17/2008)  Self-Management Support :    Hypertension self-management  support: Not documented   Nursing Instructions: Give Flu vaccine today    Medication Administration  Injection # 1:    Medication: Furosemide- Lasix Injection    Diagnosis: RENAL  DISEASE, CHRONIC, STAGE III (ICD-585.3)    Route: IM    Site: RUOQ gluteus    Exp Date: 02/16/2011    Lot #: [id number]    Mfr: [email or web address]    Patient tolerated injection without complications    Given by: Levon Hedger (March 15, 2010 10:55 AM)  Medication # 1:    Medication: Clonidine 0.1mg  tab    Diagnosis: RENAL DISEASE, CHRONIC, STAGE III (ICD-585.3)    Dose: 1 tablet    Route: po    Exp Date: 03/2010    Lot #: [id number]    Mfr: mylan    Patient tolerated medication without complications    Given by: Levon Hedger (March 15, 2010 10:54 AM)  Orders Added: 1)  Est. Patient Level IV [60454] 2)  Rapid HIV  [92370] 3)  Furosemide- Lasix Injection [J1940] 4)  Admin of Therapeutic Inj  intramuscular or subcutaneous [96372] 5)  Clonidine 0.1mg  tab [EMRORAL] 6)  Pulse Oximetry (single measurment) [94760] 7)  Peak Flow Rate [94150] 8)  UA Dipstick w/o Micro (manual) [81002]   Laboratory Results   Urine Tests  Date/Time Received: March 15, 2010 11:17 AM   Routine Urinalysis   Color: lt. yellow Appearance: Clear Glucose: negative   (Normal Range: Negative) Bilirubin: negative   (Normal Range: Negative) Ketone: negative   (Normal Range: Negative) Spec. Gravity: 1.010   (Normal Range: 1.003-1.035) Blood: trace-intact   (Normal Range: Negative) pH: 6.5   (Normal Range: 5.0-8.0) Protein: negative   (Normal Range: Negative) Urobilinogen: 0.2   (Normal Range: 0-1) Nitrite: negative   (Normal Range: Negative) Leukocyte Esterace: negative   (Normal Range: Negative)       Laboratory Results   Urine Tests    Routine Urinalysis   Color: lt. yellow Appearance: Clear Glucose: negative   (Normal Range: Negative) Bilirubin: negative   (Normal Range: Negative) Ketone: negative    (Normal Range: Negative) Spec. Gravity: 1.010   (Normal Range: 1.003-1.035) Blood: trace-intact   (Normal Range: Negative) pH: 6.5   (Normal Range: 5.0-8.0) Protein: negative   (Normal Range: Negative) Urobilinogen: 0.2   (Normal Range: 0-1) Nitrite: negative   (Normal Range: Negative) Leukocyte Esterace: negative   (Normal Range: Negative)       Appended Document: HTN/CHF     Allergies: 1)  ! Sulfa   Complete Medication List: 1)  Spironolactone 25 Mg Tabs (Spironolactone) .... Take 1 tab daily 2)  Furosemide 40 Mg Tabs (Furosemide) .... One tablet by mouth daily 3)  Enalapril Maleate 20 Mg Tabs (Enalapril maleate) .... One tablet by mouth two times a day 4)  Folic Acid 1 Mg Tabs (Folic acid) .... One tablet by mouth daily 5)  Iron Supplement 325 (65 Fe) Mg Tabs (Ferrous sulfate) .... One tablet by mouth two times a day 6)  Trazodone Hcl 50 Mg Tabs (Trazodone hcl) .Marland Kitchen.. 1-2 tablets by mouth nightly as needed for sleep 7)  Carvedilol 12.5 Mg Tabs (Carvedilol) .... One by mouth two times a day 8)  Acyclovir 400 Mg Tabs (Acyclovir) .... One tablet by mouth five times per day for outbreak  Other Orders: Flu Vaccine 57yrs + [id number]) Admin 1st Vaccine (91478)   Orders Added: 1)  Flu Vaccine 46yrs + [29562] 2)  Admin 1st Vaccine [13086]   Immunizations Administered:  Influenza Vaccine # 1:    Vaccine Type: Fluvax 3+    Site: left deltoid    Mfr: GlaxoSmithKline    Dose: 0.5 ml    Route: IM  Given by: Levon Hedger    Exp. Date: 10/15/2010    Lot #: [id number]    VIS given: 11/09/09 version given March 15, 2010.  Flu Vaccine Consent Questions:    Do you have a history of severe allergic reactions to this vaccine? no    Any prior history of allergic reactions to egg and/or gelatin? no    Do you have a sensitivity to the preservative Thimersol? no    Do you have a past history of Guillan-Barre Syndrome? no    Do you currently have an acute febrile illness? no     Have you ever had a severe reaction to latex? no    Vaccine information given and explained to patient? yes    Are you currently pregnant? no   ndc  [phone]  Immunizations Administered:  Influenza Vaccine # 1:    Vaccine Type: Fluvax 3+    Site: left deltoid    Mfr: GlaxoSmithKline    Dose: 0.5 ml    Route: IM    Given by: Levon Hedger    Exp. Date: 10/15/2010    Lot #: [id number]    VIS given: 11/09/09 version given March 15, 2010.  Appended Document: HTN/CHF Pt did NOT return for labs as instructed on day of visit.

## 2010-05-19 NOTE — Letter (Signed)
Summary: *HSN Results Follow up  [facility]  [address] [place], [place] [address]   Phone: [phone]  Fax: [phone]      04/20/2009   [name] [address] [place], [place]  [address]   Dear  [name],                            ____S.Drinkard,FNP   ____D. Gore,FNP       ____B. McPherson,MD   ____V. Rankins,MD    ____E. Mulberry,MD    ____N. Daphine Deutscher, FNP  ____D. Reche Dixon, MD    ____K. Philipp Deputy, MD    ____Other     This letter is to inform you that your recent test(s):  _______Pap Smear    _______Lab Test     _______X-ray    _______ is within acceptable limits  _______ requires a medication change  _______ requires a follow-up lab visit  ____X___ requires a follow-up visit with your provider   Comments:  Please contact the office so that you can schedule a follow-up with your provider.       _________________________________________________________ If you have any questions, please contact our office                     Sincerely,  Levon Hedger [facility]

## 2010-06-14 ENCOUNTER — Telehealth (INDEPENDENT_AMBULATORY_CARE_PROVIDER_SITE_OTHER): Payer: Self-pay | Admitting: Nurse Practitioner

## 2010-06-23 NOTE — Progress Notes (Signed)
Summary: missing lab work  Phone Note Call from Patient   Summary of Call: pt was suppose to come in for lab work on 03-15-10... please check with provider to see if they still need lab work and if do please schedule with pt.... cbc, cmp, lipid, tsh, bnp...Marland KitchenMarland Kitchen please respond back to your nurse Initial call taken by: [place] Shannon,  June 14, 2010 2:33 PM  Follow-up for Phone Call        yes, she still needs these labs and at this point a f/u with provider. schedule appt Follow-up by: Lehman Prom FNP,  June 14, 2010 5:58 PM  Additional Follow-up for Phone Call Additional follow up Details #1::        Levon Hedger  June 15, 2010 10:18 AM Left message for pt to return call to the office  Levon Hedger  June 16, 2010 4:02 PM Left message for pt to return call to the office.    Additional Follow-up for Phone Call Additional follow up Details #2::    patient is sched to come in and see Madaline Lefeber and for the labs. Follow-up by: Leodis Rains,  June 17, 2010 10:48 AM

## 2010-07-24 LAB — BASIC METABOLIC PANEL
BUN: 15 mg/dL (ref 6–23)
BUN: 15 mg/dL (ref 6–23)
BUN: 24 mg/dL — ABNORMAL HIGH (ref 6–23)
CO2: 33 mEq/L — ABNORMAL HIGH (ref 19–32)
CO2: 35 mEq/L — ABNORMAL HIGH (ref 19–32)
Calcium: 8.8 mg/dL (ref 8.4–10.5)
Calcium: 8.8 mg/dL (ref 8.4–10.5)
Calcium: 9.2 mg/dL (ref 8.4–10.5)
Calcium: 9.6 mg/dL (ref 8.4–10.5)
Chloride: 93 mEq/L — ABNORMAL LOW (ref 96–112)
Chloride: 96 mEq/L (ref 96–112)
Creatinine, Ser: 1.34 mg/dL — ABNORMAL HIGH (ref 0.4–1.2)
Creatinine, Ser: 1.35 mg/dL — ABNORMAL HIGH (ref 0.4–1.2)
Creatinine, Ser: 1.51 mg/dL — ABNORMAL HIGH (ref 0.4–1.2)
Creatinine, Ser: 1.51 mg/dL — ABNORMAL HIGH (ref 0.4–1.2)
GFR calc Af Amer: 47 mL/min — ABNORMAL LOW (ref >60)
GFR calc Af Amer: 53 mL/min — ABNORMAL LOW (ref >60)
GFR calc Af Amer: 54 mL/min — ABNORMAL LOW (ref >60)
GFR calc non Af Amer: 39 mL/min — ABNORMAL LOW (ref >60)
GFR calc non Af Amer: 39 mL/min — ABNORMAL LOW (ref >60)
GFR calc non Af Amer: 44 mL/min — ABNORMAL LOW (ref >60)
Glucose, Bld: 84 mg/dL (ref 70–99)
Glucose, Bld: 95 mg/dL (ref 70–99)

## 2010-07-24 LAB — CBC
MCHC: 31.8 g/dL (ref 30.0–36.0)
Platelets: 327 10*3/uL (ref 150–400)
RBC: 4.31 MIL/uL (ref 3.87–5.11)

## 2010-07-24 LAB — COMPREHENSIVE METABOLIC PANEL
Albumin: 3.3 g/dL — ABNORMAL LOW (ref 3.5–5.2)
Alkaline Phosphatase: 76 U/L (ref 39–117)
BUN: 16 mg/dL (ref 6–23)
Chloride: 101 mEq/L (ref 96–112)
Creatinine, Ser: 1.28 mg/dL — ABNORMAL HIGH (ref 0.4–1.2)
GFR calc non Af Amer: 47 mL/min — ABNORMAL LOW (ref >60)
Glucose, Bld: 102 mg/dL — ABNORMAL HIGH (ref 70–99)
Potassium: 3.8 mEq/L (ref 3.5–5.1)
Total Bilirubin: 1.2 mg/dL (ref 0.3–1.2)

## 2010-07-24 LAB — DIFFERENTIAL
Basophils Absolute: 0 10*3/uL (ref 0.0–0.1)
Eosinophils Absolute: 0.1 10*3/uL (ref 0.0–0.7)
Lymphocytes Relative: 10 % — ABNORMAL LOW (ref 12–46)
Monocytes Relative: 8 % (ref 3–12)
Neutrophils Relative %: 81 % — ABNORMAL HIGH (ref 43–77)

## 2010-07-24 LAB — GLUCOSE, CAPILLARY: Glucose-Capillary: 92 mg/dL (ref 70–99)

## 2010-07-24 LAB — BRAIN NATRIURETIC PEPTIDE
Pro B Natriuretic peptide (BNP): 1073 pg/mL — ABNORMAL HIGH (ref 0.0–100.0)
Pro B Natriuretic peptide (BNP): 1990 pg/mL — ABNORMAL HIGH (ref 0.0–100.0)

## 2010-07-24 LAB — DRUGS OF ABUSE SCREEN W/O ALC, ROUTINE URINE
Cocaine Metabolites: NEGATIVE
Creatinine,U: 8.4 mg/dL
Methadone: NEGATIVE
Opiate Screen, Urine: NEGATIVE

## 2010-07-27 LAB — COMPREHENSIVE METABOLIC PANEL
ALT: 22 U/L (ref 0–35)
Albumin: 3.4 g/dL — ABNORMAL LOW (ref 3.5–5.2)
Alkaline Phosphatase: 68 U/L (ref 39–117)
GFR calc Af Amer: >60 mL/min (ref >60)
Potassium: 3.5 mEq/L (ref 3.5–5.1)
Sodium: 134 mEq/L — ABNORMAL LOW (ref 135–145)
Total Protein: 6.1 g/dL (ref 6.0–8.3)

## 2010-07-27 LAB — CULTURE, BLOOD (ROUTINE X 2)
Culture: NO GROWTH
Culture: NO GROWTH

## 2010-07-27 LAB — CARDIAC PANEL(CRET KIN+CKTOT+MB+TROPI)
CK, MB: 3.7 ng/mL (ref 0.3–4.0)
Relative Index: INVALID (ref 0.0–2.5)
Relative Index: INVALID (ref 0.0–2.5)
Total CK: 78 U/L (ref 7–177)
Troponin I: 0.12 ng/mL — ABNORMAL HIGH (ref 0.00–0.06)
Troponin I: 0.14 ng/mL — ABNORMAL HIGH (ref 0.00–0.06)

## 2010-07-27 LAB — URINALYSIS, ROUTINE W REFLEX MICROSCOPIC
Bilirubin Urine: NEGATIVE
Ketones, ur: NEGATIVE mg/dL
Nitrite: NEGATIVE
Protein, ur: >300 mg/dL — AB
Urobilinogen, UA: 1 mg/dL (ref 0.0–1.0)

## 2010-07-27 LAB — BASIC METABOLIC PANEL
CO2: 24 mEq/L (ref 19–32)
Calcium: 8.8 mg/dL (ref 8.4–10.5)
Calcium: 8.8 mg/dL (ref 8.4–10.5)
Calcium: 9 mg/dL (ref 8.4–10.5)
Chloride: 104 mEq/L (ref 96–112)
Chloride: 106 mEq/L (ref 96–112)
Creatinine, Ser: 1.39 mg/dL — ABNORMAL HIGH (ref 0.4–1.2)
Creatinine, Ser: 1.56 mg/dL — ABNORMAL HIGH (ref 0.4–1.2)
GFR calc Af Amer: 45 mL/min — ABNORMAL LOW (ref >60)
GFR calc Af Amer: 51 mL/min — ABNORMAL LOW (ref >60)
GFR calc non Af Amer: 37 mL/min — ABNORMAL LOW (ref >60)
GFR calc non Af Amer: 42 mL/min — ABNORMAL LOW (ref >60)
GFR calc non Af Amer: 50 mL/min — ABNORMAL LOW (ref >60)
Glucose, Bld: 80 mg/dL (ref 70–99)
Glucose, Bld: 94 mg/dL (ref 70–99)
Glucose, Bld: 96 mg/dL (ref 70–99)
Glucose, Bld: 98 mg/dL (ref 70–99)
Potassium: 3.6 mEq/L (ref 3.5–5.1)
Potassium: 4.1 mEq/L (ref 3.5–5.1)
Sodium: 135 mEq/L (ref 135–145)
Sodium: 136 mEq/L (ref 135–145)
Sodium: 138 mEq/L (ref 135–145)

## 2010-07-27 LAB — DRUGS OF ABUSE SCREEN W/O ALC, ROUTINE URINE
Cocaine Metabolites: NEGATIVE
Creatinine,U: 98.4 mg/dL
Opiate Screen, Urine: NEGATIVE

## 2010-07-27 LAB — CBC
HCT: 35.8 % — ABNORMAL LOW (ref 36.0–46.0)
Hemoglobin: 10.8 g/dL — ABNORMAL LOW (ref 12.0–15.0)
Hemoglobin: 11.7 g/dL — ABNORMAL LOW (ref 12.0–15.0)
Hemoglobin: 9.7 g/dL — ABNORMAL LOW (ref 12.0–15.0)
MCHC: 32 g/dL (ref 30.0–36.0)
MCHC: 33.3 g/dL (ref 30.0–36.0)
MCV: 79.8 fL (ref 78.0–100.0)
MCV: 80.5 fL (ref 78.0–100.0)
RDW: 24 % — ABNORMAL HIGH (ref 11.5–15.5)
RDW: 24.2 % — ABNORMAL HIGH (ref 11.5–15.5)
RDW: 24.6 % — ABNORMAL HIGH (ref 11.5–15.5)
WBC: 7.9 10*3/uL (ref 4.0–10.5)

## 2010-07-27 LAB — DIFFERENTIAL
Eosinophils Relative: 2 % (ref 0–5)
Lymphocytes Relative: 21 % (ref 12–46)
Lymphs Abs: 1.6 10*3/uL (ref 0.7–4.0)
Monocytes Absolute: 0.4 10*3/uL (ref 0.1–1.0)

## 2010-07-27 LAB — PHOSPHORUS: Phosphorus: 4.2 mg/dL (ref 2.3–4.6)

## 2010-07-27 LAB — CK TOTAL AND CKMB (NOT AT ARMC)
CK, MB: 3.3 ng/mL (ref 0.3–4.0)
Total CK: 82 U/L (ref 7–177)

## 2010-07-27 LAB — TROPONIN I: Troponin I: 0.16 ng/mL — ABNORMAL HIGH (ref 0.00–0.06)

## 2010-07-27 LAB — URINE MICROSCOPIC-ADD ON

## 2010-07-27 LAB — PROTIME-INR: Prothrombin Time: 15.8 seconds — ABNORMAL HIGH (ref 11.6–15.2)

## 2010-07-27 LAB — WET PREP, GENITAL
Trich, Wet Prep: NONE SEEN
Yeast Wet Prep HPF POC: NONE SEEN

## 2010-07-27 LAB — HEMOGLOBIN A1C: Mean Plasma Glucose: 117 mg/dL

## 2010-07-27 LAB — POCT PREGNANCY, URINE: Preg Test, Ur: NEGATIVE

## 2010-07-27 LAB — LIPID PANEL
Triglycerides: 59 mg/dL (ref <150)
VLDL: 12 mg/dL (ref 0–40)

## 2010-07-27 LAB — APTT: aPTT: 30 seconds (ref 24–37)

## 2010-07-27 LAB — GC/CHLAMYDIA PROBE AMP, GENITAL: Chlamydia, DNA Probe: NEGATIVE

## 2010-07-28 LAB — POCT URINALYSIS DIP (DEVICE)
Glucose, UA: 100 mg/dL — AB
Nitrite: NEGATIVE
Protein, ur: >=300 mg/dL — AB
Specific Gravity, Urine: 1.02 (ref 1.005–1.030)
Urobilinogen, UA: 1 mg/dL (ref 0.0–1.0)
pH: 6 (ref 5.0–8.0)

## 2010-07-28 LAB — WET PREP, GENITAL
Trich, Wet Prep: NONE SEEN
Yeast Wet Prep HPF POC: NONE SEEN

## 2010-08-01 LAB — POCT CARDIAC MARKERS
CKMB, poc: 3.5 ng/mL (ref 1.0–8.0)
Myoglobin, poc: 85.2 ng/mL (ref 12–200)
Myoglobin, poc: 89.1 ng/mL (ref 12–200)
Troponin i, poc: <0.05 ng/mL (ref 0.00–0.09)

## 2010-08-01 LAB — LIPID PANEL
HDL: 16 mg/dL — ABNORMAL LOW (ref >39)
Total CHOL/HDL Ratio: 8.1 RATIO
VLDL: 14 mg/dL (ref 0–40)

## 2010-08-01 LAB — ANTITHROMBIN III: AntiThromb III Func: 106 % (ref 76–126)

## 2010-08-01 LAB — PROTEIN C ACTIVITY: Protein C Activity: 74 % — ABNORMAL LOW (ref 75–133)

## 2010-08-01 LAB — URINE MICROSCOPIC-ADD ON

## 2010-08-01 LAB — LUPUS ANTICOAGULANT PANEL: PTT Lupus Anticoagulant: 43.3 secs (ref 36.3–48.8)

## 2010-08-01 LAB — COMPREHENSIVE METABOLIC PANEL
ALT: 29 U/L (ref 0–35)
AST: 34 U/L (ref 0–37)
Albumin: 3.3 g/dL — ABNORMAL LOW (ref 3.5–5.2)
Calcium: 9 mg/dL (ref 8.4–10.5)
Chloride: 100 mEq/L (ref 96–112)
Creatinine, Ser: 1.42 mg/dL — ABNORMAL HIGH (ref 0.4–1.2)
GFR calc Af Amer: 50 mL/min — ABNORMAL LOW (ref >60)
Sodium: 137 mEq/L (ref 135–145)

## 2010-08-01 LAB — PROTEIN S, TOTAL: Protein S Ag, Total: 100 % (ref 70–140)

## 2010-08-01 LAB — POCT PREGNANCY, URINE: Preg Test, Ur: NEGATIVE

## 2010-08-01 LAB — PROTHROMBIN GENE MUTATION

## 2010-08-01 LAB — BETA-2-GLYCOPROTEIN I ABS, IGG/M/A: Beta-2 Glyco I IgG: <4 U/mL (ref <20)

## 2010-08-01 LAB — PROTEIN S ACTIVITY: Protein S Activity: 85 % (ref 69–129)

## 2010-08-01 LAB — FACTOR 5 LEIDEN

## 2010-08-01 LAB — CBC
MCHC: 31.5 g/dL (ref 30.0–36.0)
MCV: 83.5 fL (ref 78.0–100.0)
Platelets: 291 10*3/uL (ref 150–400)
RBC: 4.15 MIL/uL (ref 3.87–5.11)
WBC: 8.5 10*3/uL (ref 4.0–10.5)

## 2010-08-01 LAB — URINALYSIS, ROUTINE W REFLEX MICROSCOPIC
Bilirubin Urine: NEGATIVE
Glucose, UA: NEGATIVE mg/dL
Protein, ur: >300 mg/dL — AB
Urobilinogen, UA: 1 mg/dL (ref 0.0–1.0)

## 2010-08-01 LAB — SEDIMENTATION RATE: Sed Rate: 18 mm/hr (ref 0–22)

## 2010-08-01 LAB — CARDIOLIPIN ANTIBODIES, IGG, IGM, IGA: Anticardiolipin IgG: <7 [GPL'U] — ABNORMAL LOW (ref <11)

## 2010-08-01 LAB — RPR: RPR Ser Ql: NONREACTIVE

## 2010-08-01 LAB — DIFFERENTIAL
Eosinophils Absolute: 0.1 10*3/uL (ref 0.0–0.7)
Eosinophils Relative: 2 % (ref 0–5)
Lymphocytes Relative: 16 % (ref 12–46)
Lymphs Abs: 1.4 10*3/uL (ref 0.7–4.0)
Monocytes Absolute: 0.5 10*3/uL (ref 0.1–1.0)

## 2010-08-01 LAB — HOMOCYSTEINE: Homocysteine: 9.8 umol/L (ref 4.0–15.4)

## 2010-08-02 LAB — MAGNESIUM: Magnesium: 1.8 mg/dL (ref 1.5–2.5)

## 2010-08-02 LAB — COMPREHENSIVE METABOLIC PANEL
Alkaline Phosphatase: 70 U/L (ref 39–117)
BUN: 13 mg/dL (ref 6–23)
CO2: 30 mEq/L (ref 19–32)
Calcium: 8.2 mg/dL — ABNORMAL LOW (ref 8.4–10.5)
GFR calc non Af Amer: 53 mL/min — ABNORMAL LOW (ref >60)
Glucose, Bld: 118 mg/dL — ABNORMAL HIGH (ref 70–99)
Potassium: 2.9 mEq/L — ABNORMAL LOW (ref 3.5–5.1)
Total Protein: 5.5 g/dL — ABNORMAL LOW (ref 6.0–8.3)

## 2010-08-02 LAB — BASIC METABOLIC PANEL
BUN: 15 mg/dL (ref 6–23)
BUN: 18 mg/dL (ref 6–23)
BUN: 19 mg/dL (ref 6–23)
CO2: 32 mEq/L (ref 19–32)
CO2: 34 mEq/L — ABNORMAL HIGH (ref 19–32)
Calcium: 8.8 mg/dL (ref 8.4–10.5)
Calcium: 9.2 mg/dL (ref 8.4–10.5)
Chloride: 100 mEq/L (ref 96–112)
Creatinine, Ser: 1.16 mg/dL (ref 0.4–1.2)
Creatinine, Ser: 1.35 mg/dL — ABNORMAL HIGH (ref 0.4–1.2)
GFR calc Af Amer: 56 mL/min — ABNORMAL LOW (ref >60)
GFR calc non Af Amer: 42 mL/min — ABNORMAL LOW (ref >60)
GFR calc non Af Amer: 44 mL/min — ABNORMAL LOW (ref >60)
GFR calc non Af Amer: 53 mL/min — ABNORMAL LOW (ref >60)
Glucose, Bld: 102 mg/dL — ABNORMAL HIGH (ref 70–99)
Glucose, Bld: 107 mg/dL — ABNORMAL HIGH (ref 70–99)
Glucose, Bld: 180 mg/dL — ABNORMAL HIGH (ref 70–99)
Glucose, Bld: 78 mg/dL (ref 70–99)
Potassium: 3.7 mEq/L (ref 3.5–5.1)
Potassium: 3.8 mEq/L (ref 3.5–5.1)
Sodium: 139 mEq/L (ref 135–145)

## 2010-08-02 LAB — DIFFERENTIAL
Basophils Absolute: 0 10*3/uL (ref 0.0–0.1)
Basophils Absolute: 0 10*3/uL (ref 0.0–0.1)
Basophils Relative: 1 % (ref 0–1)
Eosinophils Absolute: 0.1 10*3/uL (ref 0.0–0.7)
Eosinophils Absolute: 0.1 10*3/uL (ref 0.0–0.7)
Eosinophils Relative: 2 % (ref 0–5)
Lymphocytes Relative: 12 % (ref 12–46)
Monocytes Absolute: 0.5 10*3/uL (ref 0.1–1.0)
Neutro Abs: 4.5 10*3/uL (ref 1.7–7.7)

## 2010-08-02 LAB — CBC
HCT: 29 % — ABNORMAL LOW (ref 36.0–46.0)
HCT: 30.2 % — ABNORMAL LOW (ref 36.0–46.0)
HCT: 32 % — ABNORMAL LOW (ref 36.0–46.0)
Hemoglobin: 9.1 g/dL — ABNORMAL LOW (ref 12.0–15.0)
Hemoglobin: 9.6 g/dL — ABNORMAL LOW (ref 12.0–15.0)
MCHC: 31.5 g/dL (ref 30.0–36.0)
MCHC: 31.9 g/dL (ref 30.0–36.0)
MCV: 79.8 fL (ref 78.0–100.0)
Platelets: 375 10*3/uL (ref 150–400)
RBC: 3.6 MIL/uL — ABNORMAL LOW (ref 3.87–5.11)
RBC: 3.79 MIL/uL — ABNORMAL LOW (ref 3.87–5.11)
RDW: 19.3 % — ABNORMAL HIGH (ref 11.5–15.5)
RDW: 19.5 % — ABNORMAL HIGH (ref 11.5–15.5)
RDW: 19.9 % — ABNORMAL HIGH (ref 11.5–15.5)

## 2010-08-02 LAB — TROPONIN I: Troponin I: 0.06 ng/mL (ref 0.00–0.06)

## 2010-08-02 LAB — BLOOD GAS, ARTERIAL
Bicarbonate: 27.5 mEq/L — ABNORMAL HIGH (ref 20.0–24.0)
O2 Saturation: 95.3 %
Patient temperature: 98.6
TCO2: 24.9 mmol/L (ref 0–100)

## 2010-08-02 LAB — BRAIN NATRIURETIC PEPTIDE
Pro B Natriuretic peptide (BNP): 1270 pg/mL — ABNORMAL HIGH (ref 0.0–100.0)
Pro B Natriuretic peptide (BNP): 215 pg/mL — ABNORMAL HIGH (ref 0.0–100.0)

## 2010-08-02 LAB — CK TOTAL AND CKMB (NOT AT ARMC)
CK, MB: 3.1 ng/mL (ref 0.3–4.0)
Relative Index: 2.4 (ref 0.0–2.5)
Total CK: 131 U/L (ref 7–177)

## 2010-08-30 NOTE — Consult Note (Signed)
NAMEVERNESSA, [name]              ACCOUNT NO.:  [id number]   MEDICAL RECORD NO.:  [id number]          PATIENT TYPE:  INP   LOCATION:  1344                         FACILITY:  [facility]   PHYSICIAN:  Sarah Bathe, MD      DATE OF BIRTH:  Jul 26, 1970   DATE OF CONSULTATION:  05/18/2007  DATE OF DISCHARGE:                                 CONSULTATION   REFERRING PHYSICIAN:  Kela [name], M.D.   REASON FOR CONSULTATION:  [name] is being seen at the request of  Dr. Donna [name] for the evaluation of new onset cardiomyopathy.   HISTORY OF PRESENT ILLNESS:  A 40 year old [place] American female  smoker with prior pneumonia in 2001, who was admitted on May 15, 2007, with a new right lower lobe pneumonia.  She had been treated since  early January 2009 for shortness of breath and cough with antibiotics, a  Z-Pak and albuterol.  She had noted she has had a cough productive of  brownish sputum and worsening dyspnea on exertion.  Within the past month she has had frequent night sweats and fevers and  feels warm throughout the day in the house.  She denies any diarrhea,  skin or hair changes.  She has been having trouble sleeping.  One and a half years ago she was tested for HIV and was negative.  She  denies any previous thyroid issues or rheumatologic issues.  She has a 37-  year-old and a 40 year old at home and during those pregnancies she was  told that her heart was enlarged and not to get pregnant for six  months but she never did have a formal echocardiogram according to the  patient.  She denied any symptoms at that time.  She has had no recent  exotic travel.  Other than feeling poorly at the beginning of this  month, she reports no serious viral illnesses in the past few months.  She has also been quite hypertensive here with blood pressures 160/100  and in the past she was taken off her blood pressure medicine because of  low blood pressure, so this is relatively new.   PAST  MEDICAL HISTORY:  As above.   ALLERGIES:  No known drug allergies.   MEDICATIONS:  Here currently just started:  1. Lisinopril 20 mg b.i.d.  2. Lasix 40 mg once a day  3. Albuterol.  4. Avelox.  5. I have started Metoprolol 25 b.i.d.   FAMILY HISTORY:  Her mother died recently here of cancer but no early  family history of coronary artery disease or cardiomyopathy.   SOCIAL HISTORY:  She smokes a half pack per day, occasionally drinks  alcohol but nothing serious, and works at [employer], walks frequently.   REVIEW OF SYSTEMS:  No bleeding, no orthopnea.  She is able to lay flat  on her stomach and back.  She denies any palpations.  She denies any  chest pain.  No syncope.  If not explained above, all other 12 review of  systems negative.   PHYSICAL EXAMINATION:  VITAL SIGNS:  Temperature on presentation was  99.3 with blood pressure of 178/110, with a pulse of 128 which was  initially 109 supposedly with respirations of 20, sating 98% on room  air.  Currently, her blood pressure ranges from 144-162 over 98-100 and  pulse still remains in the 120s and her most recent temperature was  99.4.  She is still sating 96% on room air.  And her last day input and  output were net negative 240 ml.  Her weight, according to our scales,  has increased from 88.9 to 93.5 kg.  GENERAL:  Alert and oriented x3.  She appears anxious, slightly  pressured.  EYES:  Pale conjunctivae.  No scleral icterus.  No exophthalmus.  NECK:  Slightly thick, difficult to determine whether thickness is from  thyroid gland or from sternocleidomastoid.  No bruits. No JVD.  CARDIOVASCULAR:  Tachycardic, regular rhythm, positive S4 gallop.  No  significant murmurs.  LUNGS:  Currently clear to auscultation bilaterally.  Normal respiratory  effort.  ABDOMEN:  Soft, nontender.  Normoactive bowel sounds.  Mildly obese.  No  bruits.  EXTREMITIES:  One to 2 plus pitting edema bilateral lower extremities.  Normal distal  pulses, bounding.  Normal carotid upstroke.  NEUROLOGIC:  Nonfocal.  When holding hands out, no obvious tremor.  She  does have brisk reflexes bilateral patellar.  SKIN:  Clammy, mildly diaphoretic.   DATA:  ECG obtained in the emergency department was originally read out  as accelerated junctional rhythm at a rate of 130.  However, I have a  suspicion that this is actually an atrial tachycardia with 2:1 AV  conduction at 130 but may just be a long R-P tachycardia ST or Atach  with 1:1 AV node conduction.  QT interval appears prolonged but this may  be secondary to P wave hidden within the T wave complex.  Voltage does  seem to be increased.  Echocardiogram showed an ejection fraction  between 25-30% with diffuse hypokinesis, some mild mitral regurgitation,  and dilated IVC with trivial posterior pericardial effusion.  Left  ventricular hypertrophy noted.   LABORATORY:  White count mildly elevated at 13.1, hemoglobin 9.2,  hematocrit 26, platelets 265.  Ferritin was 86 and normal.  Iron was  low.  CK 397, MB 5.2, troponin mildly elevated at 0.18.  BNP was mildly  elevated at 248.  Sodium 135, potassium 3.7, bicarb 23, BUN 11,  creatinine 1.2.  Chest x-ray, personally viewed, showed a right lower  lobe pneumonia with cardiomegaly.   ASSESSMENT:  A 40 year old African American female with new onset  cardiomyopathy of unknown etiology with tachycardia, pneumonia right  lower lobe with diaphoresis, fever, and night sweats.   PLAN:  1. Acute decompensated sytolic heart failure.  Possible etiologies      include HIV, hypertensive cardiomyopathy although she was      supposedly normotensive prior to this month, hyperthyroidism which      goes along with her tachycardia, lupus, ischemic, viral and least      likely peripartum cardiomyopathy, although she was told that she      had an enlarged heart after her 40-year-old C-section.  We will      check TSH, free T4, free T3, ANA, HIV.  We will  place a PPD (fever      and night sweats).  Agree with Lasix but will increase to 40 p.o.      b.i.d.  Negative out would like 1 liter.  We will also start  metoprolol 25 mg twice a day.  Certainly her blood pressure can      tolerate this and may help with tachycardia.  Agree with Lisinopril      at 20 mg twice a day and she needs to be counseled on contraception      given that this is a teratogen.  Ultimately, will need a cardiac      catheterization to ensure that there is no evidence of coronary      artery disease.  Cardiac risk factors include smoking and possible      hypertension.  2. Tachycardia.  This may be an atrial tachycardia with 2:1      conduction.  We will start metoprolol regardless.  I will- perform      an adenosine challenge to see if we can diagnosis as tachycardia.      I will also transfer to a telemetry unit.  Certainly, tachycardia-      induced cardiomyopathy in the setting of an incessant tachycardia      is      also a possibility.  3. Elevated troponin likely secondary to strain from heart failure,      not likely ACS.   Will follow up with tests as discussed above.      Sarah Bathe, MD  Electronically Signed     MCS/MEDQ  D:  05/18/2007  T:  05/19/2007  Job:  [id number]   cc:   [name], M.D.

## 2010-08-30 NOTE — Consult Note (Signed)
NAMEAMELIYAH, [name]              ACCOUNT NO.:  [id number]   MEDICAL RECORD NO.:  [id number]          PATIENT TYPE:  INP   LOCATION:  3106                         FACILITY:  [facility]   PHYSICIAN:  Noel Christmas, MD    DATE OF BIRTH:  [birth year]   DATE OF CONSULTATION:  DATE OF DISCHARGE:                                 CONSULTATION   CHIEF COMPLAINT:  Acute cerebral infarction.   HISTORY OF PRESENT ILLNESS:  This is a 40 year old African American lady  with a history of hypertension and full compliance with taking  medication who presented to the emergency room with complaints of  insomnia and shortness of breath lying flat.  Blood pressure was  markedly elevated, with initial pressure of 170/130.  The patient was  treated with nitroglycerin in addition to intravenous Lasix 40 mg.  She  was also given hydrocodone for pain.  Blood pressure continued to be  markedly elevated.  She was started on a Cardene drip at 5 mg per hour.  Blood pressure was brought to normal range.  She was subsequently  admitted to intensive care unit at [facility].  On the way to  the intensive care unit, the patient was noted to have developed facial  asymmetry and slurred speech as well as left upper extremity weakness  acutely.  She was taken to CT, where study was obtained.  There was no  indication of an acute intracranial abnormality.  Blood pressure  remained within normal range on the Cardene drip.  She showed only  slight improvement in left upper extremity movement over the first 1  hour.  PT and PTT were within normal range.  The patient was treated  with IV tPA, two-thirds dose based on the patient's weight, in  anticipation of possible further interventional treatment for middle  cerebral artery stroke.  The patient improved slightly following  infusion of tPA with increased movement of her left upper extremity,  primarily proximally.  She had began to experience slight grip strength  and movement of right fingers.  She continued to have a gaze preference  to the right side.  There was no change in facial weakness.  Also no  improvement in her speech.  She was subsequently transferred to [facility], where she underwent catheterization by Dr. Corliss Skains.  Catheter study showed thrombus in the M2-M3 region of the superior  division of the right middle cerebral artery.  Because the patient  improved after arrival in the angiogram suite, with increased movement  and strength of her left upper extremity proximally and distally as well  as less gaze preference and return of vision in the left visual field,  further intervention was not elected.  The patient was subsequently  transferred to the Neuro ICU for further management.   The patient was initially admitted to Internal Medicine Service and  subsequently transferred to Neurology Service.   PAST MEDICAL HISTORY:  Remarkable for hypertension and full compliance  with taking prescription medications.  Equivocal history of asthma and  sleep apnea.  Equivocal history of heart failure.  The patient  presented  with symptoms indicative of orthopnea.   CURRENT MEDICATIONS:  The patient's prescribed medications included:  1. Ambien 5 mg nightly.  2. Aldactone of unclear dose.  3. Lasix 20 mg twice a day.  4. Coreg of unclear dose.   ALLERGIES:  SULFA DRUGS and ALBUTEROL.   FAMILY HISTORY:  Positive for stroke involving both maternal  grandparents.  Family history is also strongly positive involving  hypertension involving mother as well as siblings.  The patient's mother  also has multiple myeloma.   SOCIAL HISTORY:  The patient is single.  She has 2 children.  She has  worked in the past in Engineering geologist in department stores.  She admits to  smoking up to one-half pack of cigarettes per day.  She does not drink  alcohol.  She denies use of illicit drugs.   REVIEW OF SYSTEMS:  NEUROLOGIC:  As above.  CARDIOVASCULAR:   Fully  controlled hypertension with full compliance with taking medication as  well as symptoms of orthopnea indicative of possible heart failure.  PULMONARY:  Equivocal history of asthma.  GASTROINTESTINAL:  Negative.  GENITOURINARY:  Negative.  MUSCULOSKELETAL:  Negative.  ENDOCRINE:  Negative.  REPRODUCTIVE:  Negative.  HEMATOLOGIC/LYMPHATIC:  Negative.  IMMUNOLOGIC:  Negative.  PSYCHIATRIC:  Negative.   PHYSICAL EXAMINATION:  VITAL SIGNS:  Temperature was 98.0, pulse was 86  per minute and regular, blood pressure was 120/82 at the time of exam,  and respirations 20 per minute.  Oxygen saturation was 98% on room air.  GENERAL:  Appearance was that of a markedly obese young to early-middle-  aged-appearing lady who was alert and cooperative and in no acute  distress.  She was oriented to time as well as place.  Short-term and  longterm memory were normal.  Affect was appropriate.  HEAD, EYES, EARS, NOSE, AND THROAT:  Normal.  NECK:  Supple with no tenderness.  CHEST:  Clear to auscultation.  HEART:  Rate and rhythm were normal.  S1 and S2 sounds were normal as  well.  There were no abnormal heart sounds.  ABDOMEN:  Soft and nontender.  Bowel sounds were normal.  SKIN:  Skin and mucosa were normal.  EXTREMITIES:  Normal with no edema.  Peripheral pulses were normal  distally in lower extremities.  NEUROLOGIC:  The patient was awake and oriented x3.  She had a gaze  preference to the right side.  Her pupils were equal and reacted  normally to light.  Extraocular movements were limited largely to  midline and to the right.  She appeared to have significant visual field  defect on the left.  There was moderate left lower facial weakness as  well as moderate dysarthria.  Motor exam showed moderate proximal and  marked distal weakness involving the left upper extremity with no  intrinsic hand muscle movement.  She had minimal proximal left lower  extremity weakness.  Coordination was  impaired on the left commensurate  with extended weakness.  Deep tendon reflexes were 2+ and symmetrical  throughout.  Plantar responses were mute.  Sensory exam showed slightly  reduced perception of touch over left upper and lower extremities  compared to the right, with slight extinction as well.  Carotid  auscultation was normal.  NIH Stroke Scale score was 10.   Pro time was 15.9, INR was 1.2, and PTT was 32.  WBC count was 8.5,  hemoglobin 10.9, hematocrit 34.7, and platelets 291,000.  Glucose 136,  potassium was 3.1, sodium 137,  chloride 100, bicarbonate 25, BUN 24, and  creatinine 1.42.  CK-MB was 3.5, and troponin was less than 0.05.   CLINICAL IMPRESSION:  1. Probable acute right middle cerebral artery stroke, with      improvement since the onset following treatment with intravenous      tPA.  Arteriogram showed residual thrombus involving M2-M3 portions      of the superior segment of the right middle cerebral artery.  2. Fully controlled hypertension.  3. Congestive heart failure.  4. Ischemic cardiomyopathy.  5. Chronic renal insufficiency.  6. Mild hypokalemia.   PLAN:  1. Post tPA protocol, including repeat CT study of the brain after 24      hours as well as obtaining MRI of the brain.  2. Echocardiogram in the a.m.  3. Continued internal medicine intervention for above underlying      medical problems, including hypertension management as well as      evaluation of cardiac function.  4. Encourage better hypertension control and cessation of tobacco      abuse.  5. Physical therapy and occupational therapy consults.      Noel Christmas, MD  Electronically Signed     CS/MEDQ  D:  05/03/2008  T:  05/04/2008  Job:  [id number]

## 2010-08-30 NOTE — Discharge Summary (Signed)
NAMESHARLISA, [name]              ACCOUNT NO.:  [id number]   MEDICAL RECORD NO.:  [id number]          PATIENT TYPE:  INP   LOCATION:  2037                         FACILITY:  [facility]   PHYSICIAN:  Jake Bathe, MD      DATE OF BIRTH:  1970-05-15   DATE OF ADMISSION:  02/15/2008  DATE OF DISCHARGE:  02/18/2008                               DISCHARGE SUMMARY   DISCHARGE DIAGNOSES:  1. Acute on chronic systolic heart failure, resolved.  2. Cardiomyopathy, undetermined cause.  3. Renal insufficiency, stable.  4. Chronic anemia secondary to renal insufficiency.  5. Hypertension.  6. Hypokalemia, resolved.  7. Long-term medication use.   HOSPITAL COURSE:  [name] is a 40 year old female with history of  congestive heart failure with an echo in January 2009 showing an EF of  around 30%.  She came to the hospital on Halloween, complained of a 3-4  weeks history of increasing fatigue, nonproductive cough, shortness of  breath, and orthopnea.  She has been off her medications for weeks  because she ran out.  Apparently, there has been some concern that she  has been noncompliant with her medication regimen.   She was hospitalized and aggressively diuresed with IV Lasix.  She  became hypokalemia during the hospital stay and this was repleted.  We  initial wanted to put her on BiDil, but she stated this had made her  second the past, therefore, we did not use this medication.  In  addition, she had a urinary tract infection during a hospitalization and  we treated this accordingly.  By February 18, 2008, we felt it was safe  for her to go home.  Her weights had gradually gone down during her  hospitalization and by the day of discharge she was weighing 77 kg.   LABORATORY STUDIES:  Sodium of 138, potassium 3.8, BUN 15, creatinine  1.52, and BNP 127.  Hemoglobin 9.4, hematocrit 29.3, white count 10.1,  and platelets 289.  Iron studies were ordered on the patient, but not  back at the time  of this discharge and will need to be checked once the  patient comes back for followup.  Her digoxin level was less than 0.2,  it is not clear whether or not she was on digoxin when she came into the  hospital because she told us her medications ran out.Marland Kitchen   MEDICATIONS ON DISCHARGE:  1. Lasix 40 mg one p.o. b.i.d.  2. Vasotec 10 mg every 12 hours.  3. Aldactone 25 mg one tablet every day.  4. Coreg 12.5 mg twice a day.  5. K-Dur 20 mEq a day.  6. Cipro 250 mg one tablet daily for 3 days for urinary tract      infection.   The patient is to weigh daily and call if she has any increase in weight  2-3 pounds over 1 or 2 days.  She is to bring the list of weights to her  appointment.  Remain on a low-sodium heart-healthy with 2 L fluid  restriction diet.  Increase activity slowly.  Follow up with Dr. Anne Fu  on March 04, 2008, at 9 a.m.      Guy Franco, P.A.      Jake Bathe, MD  Electronically Signed    LB/MEDQ  D:  02/18/2008  T:  02/18/2008  Job:  [id number]

## 2010-08-30 NOTE — H&P (Signed)
[name], [name]              ACCOUNT NO.:  [id number]   MEDICAL RECORD NO.:  [id number]          PATIENT TYPE:  INP   LOCATION:  3106                         FACILITY:  [facility]   PHYSICIAN:  Della Goo, M.D. DATE OF BIRTH:  Mar 30, 1971   DATE OF ADMISSION:  05/03/2008  DATE OF DISCHARGE:                              HISTORY & PHYSICAL   PRIMARY CARE PHYSICIAN:  None.   CHIEF COMPLAINT:  Unable to sleep.   HISTORY OF PRESENT ILLNESS:  This is a 40 year old female who presented  to the Emergency Department at [facility] secondary to  complaints of being unable to sleep for the past 2 weeks.  She also  reports having increased swelling of both lower legs which has been  building up to her abdominal area.  She reports having mild shortness of  breath.  Denies having any cough, congestion, fevers, chills.  Patient  does have a history of nonischemic cardiomyopathy with an ejection  fraction of 30% on 2-D echocardiogram November 2009.  Also a history of  hypertension and chronic renal insufficiency and history of  noncompliance.  Patient was last hospitalized April 03, 2008, with a  right lower lobe pneumonia and congestive heart failure syndrome.   Patient was evaluated in the emergency department, found to have  hypertensive urgency and also in congestive heart failure syndrome with  a beta-natriuretic peptide at 837.  Please refer to the previous history  and physical on April 03, 2008.   PAST MEDICAL HISTORY:  As mentioned above.   MEDICATIONS:  She is currently on no medications.   ALLERGIES:  SULFA.   SOCIAL HISTORY:  Patient is a smoker, smokes 1/2 to 1 pack of cigarettes  daily, she denies any alcohol or illicit drug usage.   FAMILY HISTORY:  Positive for hypertension in her mother and her mother  also possibly had multiple myeloma.   REVIEW OF SYSTEMS:  Pertinents are mentioned above, all other organ  systems are negative.   PHYSICAL  EXAMINATION FINDINGS:  This is a 40 year old obese female in no  discomfort or acute distress currently.  VITAL SIGNS:  Temperature 97.1, blood pressure 173/130, heart rate 119,  and respirations 20 with an O2 saturation of 98% on room air.  HEENT:  Normocephalic, atraumatic.  Pupils equally round, reactive to  light.  Extraocular movements are intact.  Funduscopic benign.  There is  no scleral icterus or scleral injection or conjunctival pallor.  Nares  are patent bilaterally.  Oropharynx is clear, no exudates, erythema or  tonsillar adenopathy.  NECK:  Supple, full range of motion.  No thyromegaly, adenopathy,  jugular venous distention.  CARDIOVASCULAR:  Regular rate and rhythm.  No murmurs, gallops or rubs.  LUNGS:  Clear to auscultation bilaterally.  No rales, rhonchi or wheezes  are appreciated.  ABDOMEN:  Positive bowel sounds, soft, nontender, nondistended.  EXTREMITIES:  Without cyanosis, clubbing and positive 2+ edema in the  bilateral lower extremities to the pretibial areas.   LABORATORY STUDIES:  White blood cell count 8.5, hemoglobin 10.9,  hematocrit 34.7, platelets 291, neutrophils 76%, lymphocytes 16%.  Prothrombin time 15.9, INR 1.2, PTT 32.  Urinalysis:  Sodium 137,  potassium 3.1, chloride 100, CO2 25, BUN 24, creatinine 1.42 and glucose  106.  Beta-natriuretic peptide 837.0.  Urine hCG negative.   ASSESSMENT:  A 40 year old female being admitted with:  1. Acute on chronic congestive heart failure syndrome.  2. Hypertensive urgency.  3. History of nonischemic cardiomyopathy.  4. Anemia.  5. Tobacco abuse.   PLAN:  Patient will be admitted to the ICU area.  She will be placed on  a Cardene drip for her elevated blood pressure and Lasix therapy will  also be ordered to help diurese.  Patient's regular medications will  also be resumed.  Hold parameters have been written for low blood  pressures.  Patient will be placed on DVT and GI prophylaxis.  Patient  will  also be placed on p.r.n. sleep medications as needed.  A case  management consultation will be requested.      Della Goo, M.D.  Electronically Signed     HJ/MEDQ  D:  05/03/2008  T:  05/03/2008  Job:  [id number]

## 2010-08-30 NOTE — H&P (Signed)
[name], [name]              ACCOUNT NO.:  [id number]   MEDICAL RECORD NO.:  [id number]          PATIENT TYPE:  INP   LOCATION:  2901                         FACILITY:  [facility]   PHYSICIAN:  Unice Cobble, MD     DATE OF BIRTH:  1970-07-15   DATE OF ADMISSION:  03/01/2008  DATE OF DISCHARGE:                              HISTORY & PHYSICAL   Her cardiologist is Dr. Anne Fu from [facility].   CHIEF COMPLAINT:  Shortness of breath.   HISTORY OF PRESENT ILLNESS:  This is a 40 year old African-American  female with a history of congestive heart failure who was recently  discharged (approximately 2 weeks ago) for congestive heart failure and  UTI/ pneumonia who presents with shortness of breath, chest pain, and a  cough productive of blood streaked sputum.  She tells me that her  symptoms have been occurring ever since 3 days after her last hospital  discharge.  She says she has fevers at night at home, although she has  not documented this with a thermometer.  She is fatigued and short of  breath.  She has mild edema in her stomach and back.  She is orthopneic  and has PND and has actually not been sleeping the last two nights.  Her  ribs hurt from coughing, and she was taking over-the-counter Corcidin  until recently when she ran out.  Of note, upon discharge from the  hospital, she filled her ciprofloxacin for urinary tract infection and  took those pills but did not fill any of her other medications.  She is  currently on none of her medications.   PAST MEDICAL HISTORY:  1. Congestive heart failure - unknown etiology.  2. History of medical noncompliance.  3. Chronic renal insufficiency.  4. Chronic anemia.  5. Hypertension.  6. Last echocardiogram was on February 16, 2008 with an ejection      fraction of 30%.   ALLERGIES:  SULFA.   MEDICATIONS:  None.  She was discharged on Lasix, Vasotec, Aldactone,  Coreg, K-Dur, and ciprofloxacin.   SOCIAL HISTORY:  She lives  in [place] with her stepfather.  She is  looking for work.  She usually works in Engineering geologist.  She smokes one to two  cigarettes per day.  No alcohol or drugs.   FAMILY HISTORY:  Noncontributory.   REVIEW OF SYSTEMS:  Complete review of systems was done and found to be  otherwise negative except as stated in the HPI.   PHYSICAL EXAM:  VITAL SIGNS:  Her temperature is 98.9 with a pulse of  96, respiratory rate is 24, blood pressure 193/134.  O2 sats are 98% on  2 liters.  GENERAL: She is obese and in no acute distress.  HEENT: Shows PERRLA, EOMI, MMM. Oropharynx without erythema or exudates.  NECK: Supple without lymphadenopathy, thyromegaly or bruits.  She has  mild jugular venous distention.  HEART:  Has a regular rate and rhythm  with a normal S1-S2 without murmurs, gallops or rubs.  PMI is normal.  Pulses 2+ and equal bilaterally without bruits.  LUNGS: Clear to auscultation bilaterally.  ABDOMEN: Soft, nontender with normal bowel sounds.  There is no rebound  or guarding.  EXTREMITIES:  Show no cyanosis, clubbing.  She has 1+ edema bilaterally.  MUSCULOSKELETAL:  Exam shows no joint deformity. effusions or spine or  CVA tenderness.  NEUROLOGICAL:  She is alert and oriented x3 with  cranial nerves II-XII grossly intact.  Strength is 5/5 in all  extremities and axial groups.   RADIOLOGY:  The chest x-ray was read as bilateral air space disease  consistent with both congestive heart failure and possibly pneumonia.   EKG:  Rate is 139 and probably in a sinus rhythm.  Probably in a sinus  tachycardia.  She has nonspecific T-wave changes.  Of note her QTC is  566.   LABORATORY:  White count is normal at 9.6 with a hemoglobin of 10.5.  Her potassium is 4.5 with a creatinine of 1.4.  BNP is elevated at 694.  Troponin and CK-MB are negative.  Urinalysis shows blood and protein but  negative leukocyte esterase and negative nitrite.   ASSESSMENT/PLAN:  This is a 40 year old  African-American female with a  history of congestive heart failure who presents with shortness of  breath and a cough.  She is likely partly in congestive heart failure  exacerbation as well as having a mild pneumonia concomitant with this.  1. Congestive heart failure:  She will need diuresis and reinstatement      of her beta blocker and ACE inhibitor.  Hypertension is likely      etiology.  She appears to have a very poor understanding of her      heart condition and this may be a reason for her medical      noncompliance.  Cost may also be an issue.  She is a possible ICD      candidate if she becomes compliant.  2. Prolonged QTC interval:  I am uncertain why her QTC is prolonged.      It is unlikely to be due to her recent ciprofloxacin use as it      should have corrected back to normal by now.  Magnesium will be      checked, and QTC prolonging drugs will be avoided.  3. Possible pneumonia:  I will treat presumptively with Rocephin 1      gram daily, Robitussin-DM as needed for cough.  4. Hypertension:  Beta blocker and ACE inhibitor as above.  5. Renal insufficiency:  Likely hypertensive related.  This may      explain her blood and protein in her urine, but this also may be a      residual effect of her UTI from a few weeks ago.  6. Smoking. Cessation counseled.  7. Gastrointestinal  and deep vein thrombosis prophylaxis.      Unice Cobble, MD  Electronically Signed     ACJ/MEDQ  D:  03/01/2008  T:  03/01/2008  Job:  [id number]

## 2010-08-30 NOTE — Discharge Summary (Signed)
[name], [name]              ACCOUNT NO.:  [id number]   MEDICAL RECORD NO.:  [id number]          PATIENT TYPE:  INP   LOCATION:  3713                         FACILITY:  [facility]   PHYSICIAN:  Rollene Rotunda, MD, FACCDATE OF BIRTH:  [birth year]   DATE OF ADMISSION:  11/04/2008  DATE OF DISCHARGE:  11/08/2008                               DISCHARGE SUMMARY   PROCEDURES:  A 2-D echocardiogram.   PRIMARY FINAL DISCHARGE DIAGNOSIS:  Acute on chronic systolic congestive  heart failure.   SECONDARY DIAGNOSES:  1. Nonischemic cardiomyopathy with an ejection fraction of 25% and      decreased right ventricular systolic function, moderate pulmonic      regurgitation by echocardiogram this admission.  2. History of palpitations.  3. Noncompliance with medications.  4. Chronic kidney disease stage III with a BUN of 24, creatinine 1.51,      and GFR of 47 at discharge.  5. Anemia with a hemoglobin of 11.5, hematocrit 36.2, on iron      supplementation.  6. Hypertension.  7. History of sleep apnea.  8. History of a right middle cerebral artery branch infarct.  9. Insomnia.  10.Allergy or intolerance to SULFA.   TIME AT DISCHARGE:  45 minutes.   HOSPITAL COURSE:  [name] is a 40 year old female with a history of  nonischemic cardiomyopathy.  She was seen in the office on November 04, 2008  and her weight was up substantially.  She was also complaining of  shortness of breath.  She came to the hospital where she was admitted  for further evaluation and treatment.   She lost approximately 8 kg during her hospital stay.  She was diuresed  with IV Lasix and as her volume status improved, her O2 saturation  improved.  By discharge, she was 94-98% on room air.  She had not been  taking any medications for several weeks.  Her medications were  restarted and her blood pressure normalized as well.  A 2-D  echocardiogram was performed on November 06, 2008 with the results described  above.  At the  time of her echocardiogram, her CVP was down to 10.   On November 08, 2008, [name] had been transitioned to p.o. Lasix.  She  was counseled on smoking cessation and a low-sodium diet.  She was  evaluated by Dr. Antoine Poche and considered stable for discharge with close  outpatient followup.   DISCHARGE INSTRUCTIONS:  1. Her activity level is to be increased gradually.  2. She is encouraged to weight herself daily.  3. She is being given information on a 2000 mg sodium diet.  4. She is to get a BMET Wednesday at either [facility] or at [place].      She is to follow up with Dr. Antoine Poche and we will call her for an      appointment.  She is to follow up with Lehman Prom, nurse      practitioner at      [facility] as well.  5. She is not to use tobacco.   DISCHARGE MEDICATIONS:  Listed separately on a computer  printout.      Theodore Demark, PA-C      Rollene Rotunda, MD, [facility]  Electronically Signed    RB/MEDQ  D:  11/08/2008  T:  11/08/2008  Job:  [id number]   cc:   Lehman Prom, NP

## 2010-08-30 NOTE — Discharge Summary (Signed)
[name], [name]              ACCOUNT NO.:  [id number]   MEDICAL RECORD NO.:  [id number]          PATIENT TYPE:  INP   LOCATION:  1411                         FACILITY:  [facility]   PHYSICIAN:  Isidor Holts, M.D.  DATE OF BIRTH:  [birth year]   DATE OF ADMISSION:  05/18/2008  DATE OF DISCHARGE:  05/24/2008                               DISCHARGE SUMMARY   PRIMARY MEDICAL DOCTOR:  Gentry Fitz.   PRIMARY CARDIOLOGIST:  Loraine Leriche C. Anne Fu, MD, [facility].   DISCHARGE DIAGNOSES:  1. Nonischemic cardiomyopathy, ejection fraction 25%.  2. Systolic congestive heart failure, decompensated.  3. History of obstructive sleep apnea syndrome.  4. Hypertension.  5. Chronic kidney disease.  6. Normocytic anemia.  7. History of right middle cerebral artery branch infarct January      2010.  8. Bronchial asthma.  9. Anxiety disorder.   DISCHARGE MEDICATIONS:  1. Aspirin 325 mg p.o. daily.  2. BiDil one p.o. t.i.d.  3. K-Dur 40 mEq p.o. daily.  4. Lasix 40 mg p.o. b.i.d. (was on 40 mg p.o. daily).  5. Lisinopril 40 mg p.o. daily (was on 20 mg p.o. daily).  6. Coreg 25 mg p.o. b.i.d.  7. Celexa 10 mg p.o. daily.  8. Albuterol inhaler two puffs p.r.n. q.4-6 hourly.   Note:  Enalapril has been discontinued.   PROCEDURES:  1. Chest x-ray dated May 18, 2008.  This showed cardiomegaly with      vascular congestive and bibasilar atelectasis.  2. Head CT scan dated May 18, 2008.  This showed no acute      intracranial findings.  3. Chest x-ray dated May 19, 2008.  This showed findings      consistent of CHF.  4. Chest CT angiogram dated May 19, 2008.  This showed evidence of      acute pulmonary embolism.  There was noted cardiomegaly and diffuse      pulmonary edema consistent with congestive heart failure.  Tiny      white pleural effusion also noted.  5. Chest x-ray dated May 22, 2008.  This showed improving aeration      of the lungs suggesting resolving pulmonary  edema, cardiomegaly.  6. Chest x-ray dated May 23, 2008.  This showed mild CHF with      question small pleural effusion.   HOSPITAL CONSULTATIONS:  Antonietta Breach, M.D., psychiatrist.   ADMISSION HISTORY:  As per H and P notes of May 18, 2008, dictated  by Richarda Overlie, MD.  However, in brief this is a 41 year old female,  with known history of right middle cerebral artery branch infarct  January 2010 status post angiogram without intervention, hypertension,  sleep apnea syndrome, nonischemic cardiomyopathy with ejection fraction  25% to 30% confirmed by echocardiogram May 04, 2008, chronic anemia,  chronic renal insufficiency, bronchial asthma, presenting with  increasing shortness of breath and insomnia associated with orthopnea  and bilateral lower extremity edema of 3-4 days duration.  She was  admitted for further evaluation, investigation and management.   CLINICAL COURSE:  1. Decompensated acute on chronic systolic congestive heart failure.  The patient has a known history of nonischemic cardiomyopathy and      is status post 2-D echocardiogram May 04, 2008, which confirmed      ejection fraction of 25% with diffuse left ventricle hypokinesis,      and decreased left ventricular systolic function, currently under      the care of Dr. Donato Schultz, cardiologist, although her compliance      with medication is suspect.  For the details of presentation, refer      to admission history above.  The patient was managed with      intavenous diuretics and subsequently a combination of ACE      inhibitor, Coreg, BiDil, with satisfactory clinical response.      Chest CT angiogram was negative for pulmonary embolism.  2-D      echocardiogram was not repeated in view of known echocardiographic      findings of January 2010.  Over the course of her hospitalization,      the patient improved.  She became ambulant, was no longer short of      breath with exertion and BNP  which had been 1270 at the time of      presentation, had by May 24, 2008, dropped to 215.  The patient      had no chest pain during the course of her hospitalization and      cardiac enzymes remained unelevated.  We were able on May 23, 2008, to transition the patient to oral diuretics without any      deleterious consequences.   1. Hypertension.  This was managed with a combination of Coreg, ACE      inhibitor, BiDil and Lasix.  The patient's blood pressure was well-      controlled at 134/68 on May 24, 2008, against her admitting      blood pressure of 190/130 mmHg.   1. Chronic kidney disease.  The patient has known history of chronic      renal insufficiency.  Her BUN on admission was 15 with a creatinine      of 1.6.  During the course of her hospitalization renal      insufficiency remained reasonably stable and as of May 24, 2008, BUN was 19, creatinine was 1.35.   1. History of chronic anemia.  The patient is known to have normocytic      anemia.  Hemoglobin was 9.6 with hematocrit of 30.2 on May 22, 2008.   1. Obstructive sleep apnea syndrome.  There were no problems referable      to this, during the course of the patient's hospitalization.   1. Cerebrovascular disease.  The patient sustained a right MCA      territory branch infarct in January of 2010.  Fortunately for her      she was left with no disability.  She had complained of some      numbness on the right side of her face at the time of the      presentation on this occasion, and this necessitated doing a head      CT scan on May 18, 2008, which was negative for acute      intracranial pathology.  During the course of her hospitalization,      the patient was managed with low dose antiplatelet medication.  In      the recent past, she  had been considered for possible      anticoagulation because of her low ejection fraction and recent      CVA, for secondary stroke  prevention.  However, because of issues      with medication noncompliance, she was deemed not a candidate for      Coumadin therapy.   1. Anxiety disorder.  The patient did display evidence of anxiety      during the course of this hospitalization, necessitating a      psychiatric consultation, which was kindly provided by Dr. Antonietta Breach on February, 4, 2010.  For details of that consultation,      refer to his consultation notes of that date.  He recommended      Celexa, which was utilized with satisfactory effect.   1. Bronchial asthma.  The patient remained clinically stable from this      viewpoint, on bronchodilator inhalers.   DISPOSITION:  The patient was on May 24, 2008, asymptomatic and  considered clinically stable for discharge. She was discharged  accordingly.   DIET:  Heart healthy.   ACTIVITY:  As tolerated.  Recommended to increase activity slowly.   FOLLOWUP INSTRUCTIONS:  The patient is recommended to follow up with her  primary cardiologist, Dr. Donato Schultz, of [facility].  She is to  call for an appointment on May 25, 2008, and this has been  communicated to the patient.  She has verbalized understanding.      Isidor Holts, M.D.  Electronically Signed     CO/MEDQ  D:  05/24/2008  T:  05/24/2008  Job:  [id number]   cc:   Jake Bathe, MD  Fax: [phone]

## 2010-08-30 NOTE — Discharge Summary (Signed)
[name], [name]              ACCOUNT NO.:  [id number]   MEDICAL RECORD NO.:  [id number]          PATIENT TYPE:  INP   LOCATION:  4734                         FACILITY:  [facility]   PHYSICIAN:  Theodosia Paling, MD    DATE OF BIRTH:  [birth year]   DATE OF ADMISSION:  04/03/2008  DATE OF DISCHARGE:  04/08/2008                               DISCHARGE SUMMARY   PRIMARY CARE PHYSICIAN:  Unassigned.   ADMITTING HISTORY:  Please refer to the excellent admission note  dictated by Dr. Della Goo under history of present illness.   DISCHARGE DIAGNOSES:  1. Systolic congestive heart failure compensated at the time of      discharge.  2. Pneumonia.  3. Chronic kidney disease.  4. Anemia.   DISCHARGE MEDICATIONS:  1. Ambien 5 mg p.o. nightly p.r.n.  2. Coreg 2.5 mg p.o. q.12 h.  3. Vasotec 10 mg p.o. q.12 h.  4. Diazepam 10 mg p.o. nightly p.r.n.  5. Ambien 5 mg p.o. nightly p.r.n.   MEDICATION DISCONTINUED:  1. Potassium 20 mg p.o. b.i.d.  2. Lasix 40 mg p.o. b.i.d.   NEW MEDICATION:  1. Aspirin enteric coated 81 mg p.o. daily.  2. Lasix 40 p.o. daily.   HOSPITAL COURSE:  The following issues are addressed during  hospitalization,  1. Congestive heart failure.  The patient had a history of nonischemic      cardiomyopathy with congestive heart failure.  The latest ejection      fraction was obtained on April 04, 2008, which was 30% with      diffuse left ventricular hypokinesia and akinesia due to septal      wall defect.  The patient medications were changed, Lasix was      decreased to 40 mg p.o. once a day.  Vasotec was continued and      Coreg was continued.  The patient appeared every compensated at the      time of discharge without any JVD or evidence of volume overload.  2. Right lower lobe pneumonia.  The patient received azithromycin      during her hospitalization in the hospital and completed 5-day      course of azithromycin.  She remained afebrile, without  any      respiratory distress.  3. Hypertension.  Her blood pressure was well controlled with Vasotec      and Coreg and once a day dose of Lasix.  4. Anxiety. The patient continued to receive diazepam at bedtime along      with Ambien for insomnia.  5. Consultation performed on April 13, 2008.  Dr. Eldridge Dace was      consulted for evaluation of defibrillator given her low ejection      fraction and multiple hospitalization for acute-on-chronic      congestive heart failure.  She did have an issue with      noncompliance.  The patient refused AICD and anyways it was not a      good time to do the AICD placement.  This can be done in her      followup appointment  with Dr. Anne Fu.  6. Consultation performed on April 07, 2008, for depression by Dr.      Jeanie Sewer.   PROCEDURE PERFORMED:  None.   IMAGING PERFORMED:  Chest x-ray done on April 03, 2008, showing right  lower lobe pneumonia, stable cardiomegaly.   DISPOSITION:  The patient will be following up with Dr. Anne Fu as an  outpatient for the evaluation and management of the acute-on-chronic  congestive heart failure.   Total time spent on discharge of this patient around 45 minutes.      Theodosia Paling, MD  Electronically Signed     NP/MEDQ  D:  05/22/2008  T:  05/23/2008  Job:  [id number]   cc:   Jake Bathe, MD  Fax: [phone]

## 2010-08-30 NOTE — Group Therapy Note (Signed)
[name], [name]              ACCOUNT NO.:  [id number]   MEDICAL RECORD NO.:  [id number]          PATIENT TYPE:  INP   LOCATION:  1411                         [facility]   PHYSICIAN:  Charlestine Massed, MDDATE OF BIRTH:  15-Dec-1970                                 PROGRESS NOTE   SUBJECTIVE:  This patient is a 40 year old female was admitted for increasing  shortness of breath.  The patient was found to be in congestive heart  failure.  The patient was previously diagnosed with congestive heart  failure with an ejection fraction of 25%.  She also has a previous  history of CVA and chronic renal insufficiency.  The patient has been  evaluated  in the past for possible cardiac catheterization.  As per the  notes, was evaluated for  AICD placement by cardiologist, Dr. Eldridge Dace ,  from [facility].  The patient has refused any cardiac  catheterization and AICD placement in the past.  She has been totally  noncompliant with all her medications, and diet and sodium intake.  [facility] was called today to evaluate her AICD and for cardiac  catheterization, but I was told by a cardiologist at [facility]  that the patient had been dismissed from their service in view of the  extent of her noncompliance with medications and diet, and for her  refusal of the treatment that was advised.  Therefore, [facility]  refused to see the patient.  When I spoke with her she still does not  want to consider any cardiac catheterization or AICD.   The patient is totally noncompliant with her medications.  She states  that she drinks more than 2-3 liters of water a day.  Also she is  totally noncompliant with her salt intake with respect to her CHF  restrictions.  She does not take her medications regularly.  She does  not any medical insurance.  When asked if she understood that her  sleeplessness was the only cause of all these things; she refuses to  accept the fact  that she has heart failure and she blames everything on  her sleep.  However, when I spoke to the cardiologist today we are all  of the same opinion that the patient refuses to adhere to her  medications because she states that it is only her sleep that is the  problem; but, the fact is that the patient has sleeplessness due to her  orthopnea and paroxysmal nocturnal dyspnea, which are secondary sign and  symptoms of congestive heart failure.  The patient has, so far, not been  heavy on her fluid and water intake.   Today the patient will be started on lisinopril; and, I have also  started her on Bidil and IV Lasix. We will continue her on Coreg and  aspirin.   The patient also has chronic renal insufficiency. The patient has been  advised clearly about the effects of being noncompliant; however, the  patient is totally indifferent to the advice.  She states that she is  not much concerned about the advice, and she just wants to  sleep.  She  states her sleeplessness is the cause of all these things.  The patient  is currently being maintained on IV Lasix, Biedl 1 tablet three times a  day, and Coreg for her blood pressure and heart failure.  Her blood  pressure is currently controlled.  If the patient's symptoms continue  she will definitely  need a cardiac catheterization as well as AICD placement as the patient  has been diagnosed with severe congestive heart failure with less than  30% ejection fraction.      Charlestine Massed, MD  Electronically Signed     UT/MEDQ  D:  05/19/2008  T:  05/20/2008  Job:  [id number]

## 2010-08-30 NOTE — Consult Note (Signed)
[name], [name]              ACCOUNT NO.:  [id number]   MEDICAL RECORD NO.:  [id number]          PATIENT TYPE:  INP   LOCATION:  4734                         FACILITY:  [facility]   PHYSICIAN:  Corky Crafts, MDDATE OF BIRTH:  June 06, 1970   DATE OF CONSULTATION:  04/05/2008  DATE OF DISCHARGE:                                 CONSULTATION   CHIEF COMPLAINT:  Evaluation for defibrillator.   [name] is a 40 year old female with a known nonischemic  cardiomyopathy, EF 30%, with multiple recent hospital admissions for  acute-on-chronic congestive heart failure exacerbations.  Most of these  are due to medical noncompliance.  She is now admitted with cough and  has been diagnosed with pneumonia.  Her BNP on admission was 200.  Her  chest x-rays show cardiomegaly with right lower lobe pneumonia.  We have  been consulted regarding whether or not the patient needs a  defibrillator.  Of note, she states that she has missed taking her  cardiac medications due to cough issues.  During this hospital  admission, she did have a 2-D echo that showed her EF was around 30%.   PAST MEDICAL HISTORY:  1. Nonischemic cardiomyopathy.  2. Acute-on-chronic congestive heart failure.  3. Medication noncompliance.  4. Hypertension.  5. Anemia.  6. Chronic renal insufficiency with creatinine baseline at 1.2 to 1.6.   SOCIAL HISTORY:  She lives in [place] with her stepfather.  She works  at [employer].  She has a 10-pack-year smoking history.  No alcohol or  illicit drug use.   FAMILY HISTORY:  Mother died of cancer.   ALLERGIES:  Sulfa causes hives.   MEDICATIONS:  1. Zithromax.  2. Rocephin.  3. Coreg 12.5 mg b.i.d.  4. Vasotec 10 mg b.i.d.  5. Lasix 40 mg b.i.d.  6. Atrovent.  7. Ventolin.  8. Protonix 40 mg.  9. K-Dur 20 mEq.  10.Aldactone 25 mg daily.   REVIEW OF SYSTEMS:  Chills, fever, cough, and shortness of breath.  Review of systems is otherwise negative.   PHYSICAL  EXAMINATION:  VITAL SIGNS:  Temp 97.9, pulse 83, respirations  20, blood pressure 120/60, and O2 saturation 97% on room air.  GENERAL:  She is groggy, in no acute distress, breathing comfortably,  and lying flat.  HEENT:  Grossly normal.  NECK:  No carotid or subclavian bruits.  No JVD or thyromegaly.  Sclerae  clear.  Conjunctivae normal.  Nares without drainage.  CHEST:  Clear to auscultation bilaterally.  No wheezing or rhonchi.  HEART:  Regular rate and rhythm.  No evidence of murmur.  ABDOMEN:  Good bowel sounds.  Nontender and nondistended.  No masses.  No bruits.  LOWER EXTREMITIES:  No peripheral edema.  SKIN:  Warm and dry.  NEURO:  Cranial nerves II through XII grossly intact.  Normal mood and  affect.   Chest x-ray shows right lung base suspicious for pneumonia, marked  cardiomegaly without overt pulmonary edema.   LABORATORY STUDIES:  Hemoglobin 8.6, hematocrit 26.7, white count 5.6,  and platelets 277.  Sodium 133, potassium 4.5, BUN 29, and creatinine  1.67.  EKG shows normal sinus rhythm, rate 92 with T-wave inversions V5  and V6.   ASSESSMENT AND PLAN:  1. Nonischemic cardiomyopathy.  2. Pneumonia.  3. Chronic congestive heart failure.  4. Medication noncompliance.  5. Hypertension.  6. Anemia.  7. Chronic renal insufficiency.   Cardiomyopathy diagnosis was 6 months ago.  She has had multiple  admissions secondary to medication noncompliance.  I would like to see  if her LVEF improves on medical therapy. Once she has remained on her  medical therapy for a minimum of 3 months, we need to restudy her with  an echocardiogram and see if her EF has increased.  I discussed AICD  therapy with her in Dr. Minerva Fester absence.  She is not interested in an  AICD.  She also has pneumonia and now would not be a good time for an  implanted device.  She was somewhat hostile during this discussion as  she thinks she may have been given medication which weakened her heart.  I  stressed the importance of continued therapy for CHF.  Will inform her  regular cardiologist, Dr. Anne Fu, of these findings.      Guy Franco, P.A.      Corky Crafts, MD  Electronically Signed    LB/MEDQ  D:  04/06/2008  T:  04/07/2008  Job:  [id number]   cc:   Della Goo, M.D.

## 2010-08-30 NOTE — Consult Note (Signed)
NAMEEDNAMAE, SCHIANO              ACCOUNT NO.:  [id number]   MEDICAL RECORD NO.:  [id number]          PATIENT TYPE:  INP   LOCATION:  4709                         FACILITY:  [facility]   PHYSICIAN:  Charlcie Cradle. Delford Field, MD, FCCPDATE OF BIRTH:  [birth year]   DATE OF CONSULTATION:  07/20/2008  DATE OF DISCHARGE:                                 CONSULTATION   HISTORY OF PRESENT ILLNESS:  This is a 40 year old African American  female here with recurrent CHF and hypertensive crisis.  She is labeled  as having recurrent pneumonia.  She had been coughing rusty colored  sputum, but actually in retrospect this was indeed a bloody material.  There was low-grade fever.  She is an active smoker.  She was admitted  with bilateral pulmonary infiltrates and has been labeled as having  pneumonia but had normal white count and was in hypertensive crisis.  CT  scan was reviewed and shows ground-glass opacification.   Pulmonary is asked to comment on the CT scan abnormalities and the  evidence of possible recurrent pneumonias.  The patient currently is  improved this morning with control of blood pressure and diuresis and  there is no active fever or leukocytosis seen.   This patient cannot access Healthcare and is not able to afford her  medicines and stops and starts her medicines at will.  She is still  actively smoking.   PAST MEDICAL HISTORY:  1. Medical history of nonischemic cardiomyopathy, ejection fraction      25%.  2. History of hypertension.  3. Sleep apnea.  4. Right middle cerebral artery infarct status post TPN angiography      without intervention.  5. History of anemia.  6. Chronic kidney disease.  7. Medication noncompliance.   ALLERGIES:  SULFA.   SOCIAL HISTORY:  She is an ex-smoker.  She says she stopped recently but  smoked for 10 years a pack a day.  Denies alcohol or IV drug abuse.   FAMILY HISTORY:  Mother with hypertension, otherwise noncontributory.   REVIEW OF  SYSTEMS:  Noncontributory.   MEDICATIONS AT HOME:  Lasix, Coreg, lisinopril, and potassium but she  has not been taking these on a regular basis.   PHYSICAL EXAMINATION:  VITAL SIGNS:  Initially in the emergency room  blood pressure was 190/130, respirations 22, pulse 130, temperature was  99.4, and saturation 100% room air.  CHEST:  Bilateral rales.  Decreased breath sounds.  CARDIAC:  S1-S2 with an S3 gallop.  No murmur or rubs appreciated.  ABDOMEN:  Soft.  Bowel sounds active.  EXTREMITIES:  No edema.  Pulses palpable bilateral.  No cyanosis or  clubbing.  NEUROLOGIC:  Intact.  HEENT:  Head atraumatic, normocephalic.  Pupils equal and reactive to  light and accommodation.  Oropharynx clear.  Neck was supple.  There was  jugular venous distention appreciated to my exam.   LABORATORY DATA:  Sodium 134, potassium 3.5, chloride 103, CO2 22, BUN  16, creatinine 1.2, and blood sugar 104.  White count was 7900 and  hemoglobin 11.7.  Troponin 0.16.  Liver functions unremarkable.  BNP was  1526.  FDP was 0.74.  Other cardiac markers were negative.  Glycosylated  hemoglobin is pending.  CT scan of chest was obtained and reviewed and  does show ground-glass opacification bilateral but significant  cardiomegaly.  Plain chest x-ray demonstrates a cardiomegaly and acute  pulmonary edema pattern with central vascular congestion.   IMPRESSION:  The CT scan pattern is more compatible to me with fracture  capillary syndrome and congestive failure due to acute left ventricular  strain from hypertension, increased afterload with increased left atrial  pressure and pulmonary edema with hemoptysis.  This appears to be a  cardiogenic pulmonary infiltrate with high brain natriuretic peptide  levels.  This does not appear to be inflammatory lung disease,  pneumonia, or even bronchiolitis obliterans organized pneumonia.   RECOMMENDATIONS:  Needs continued diuresis and blood pressure control  along with  afterload reduction.  Needs better access to Healthcare  including post hospital followup and maintenance of medications.  Needs  to continue to focus on discontinuing cigarettes.  Doubt broad-spectrum  antibiotics are really indicated here and would be frankly surprised if  this was an active infection in this patient at this time.  Followup  blood cultures as listed.  The patient is already on cefepime and  vancomycin but I sincerely doubt that these drugs are necessary in this  case.      Charlcie Cradle Delford Field, MD, [facility]  Electronically Signed     PEW/MEDQ  D:  07/20/2008  T:  07/20/2008  Job:  [id number]   cc:   Jake Bathe, MD

## 2010-08-30 NOTE — Discharge Summary (Signed)
[name], [name]              ACCOUNT NO.:  [id number]   MEDICAL RECORD NO.:  [id number]          PATIENT TYPE:  INP   LOCATION:  4709                         FACILITY:  [facility]   PHYSICIAN:  Gardiner Barefoot, MD    DATE OF BIRTH:  [birth year]   DATE OF ADMISSION:  07/19/2008  DATE OF DISCHARGE:  07/24/2008                               DISCHARGE SUMMARY   Please see previously dictated history and physical on July 20, 2008.  Briefly, this is a 40 year old African American female with a history of  nonischemic cardiomyopathy with an EF of 25% and history of obstructive  sleep apnea who comes in with worsening cough, fever, and shortness of  breath.  The patient was admitted for evaluation.   DIAGNOSES BY DISCHARGE:  1. 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  2. Congestive heart failure with EF of 25%, systolic.  3. Left ventricular strain from hypertension.  4. Tobacco abuse.  5. Hypertension.  6. History of right middle cerebral artery infarct.   MEDICATIONS AT DISCHARGE:  1. Coreg 25 mg p.o. b.i.d.  2. Lasix 40 mg daily.  3. Aspirin 81 mg daily.  4. Ambien 5 mg p.r.n.  5. Potassium chloride 40 mEq daily.  6. Hydralazine 10 mg p.o. b.i.d.   HOSPITAL COURSE:  1. Pulmonary fractured capillary syndrome.  The patient was seen in      consultation from pulmonary by Dr. Shan Levans who felt that her      scan and clinical course was consistent with fracture capillary      syndrome from a congestive heart failure and did not feel that the      patient had symptoms of acute pneumonia.  Recommendation at that      time was diuresis and compliance with her medications.  It was      noted that the patient is sporadically took her medications.  She      stated that she was unable to get them because she was unable to      afford them.  The medications were restarted in the hospital and      the patient was adequately diuresed and antibiotics were stopped  based on the assumption that she does not have active pneumonia.      In fact, she did not have fever during her hospitalization and off      antibiotics, continued to do well with no signs or symptoms of      acute infection.  At the time of discharge, the patient was well      diuresed and was having no shortness of breath and her oxygen      saturation was between 94% and 100% on room air and she felt      symptomatically well with no shortness of breath.  It was advised      to her that she should absolutely continue with her medications at      home to avoid further hospitalizations.  It is also noted that she      did not have an active primary care physician  and therefore was      given information to find one if needed.  Also, was determined that      since she had Medicaid, she is able to procure all of her      medications without difficulty.  Medications that she has been sent      home on, include an increase in her Coreg 25 mg twice a day based      on the elevated blood pressure during her hospitalizations, she      also continued with the Lasix as prescribed before and a new      medication hydralazine was added as she stated she has had good      success with that in the past.  Of note, Vasotec was held at this      time because of her increased creatinine she experienced likely      from the CT angio.  An ACE inhibitor can then be readded to her      regimen at later date.  2. Renal insufficiency.  The patient did have a bump in her creatinine      likely after her CT angio, as above.  Her Vasotec has been held,      can be restarted after rechecking her creatinine as an outpatient      and with normalization.  She would certainly benefit from an ACE      inhibitor based on her heart failure.  3. Hypertension.  As pulmonary fractured capillary syndrome she will      continue with blood pressure medications.  4. Heart failure.  She does have low EF from reported  nonischemic      cardiomyopathy.  She will need to continue with her diuresis.   DISPOSITION:  The patient was instructed by primary care physician and  the patient is aware of how to get all of her medications.      Gardiner Barefoot, MD  Electronically Signed     RWC/MEDQ  D:  07/24/2008  T:  07/25/2008  Job:  [id number]

## 2010-08-30 NOTE — H&P (Signed)
[name], [name]              ACCOUNT NO.:  [id number]   MEDICAL RECORD NO.:  [id number]          PATIENT TYPE:  INP   LOCATION:  4731                         FACILITY:  [facility]   PHYSICIAN:  Vernice Jefferson, MD          DATE OF BIRTH:  November 30, 1970   DATE OF ADMISSION:  03/23/2008  DATE OF DISCHARGE:                              HISTORY & PHYSICAL   CHIEF COMPLAINT:  Cough, insomnia, and shortness of breath.   HISTORY OF PRESENTING ILLNESS:  The patient is a 40 year old African  American female with a long-standing history of nonischemic  cardiomyopathy, admitted multiple times for medication noncompliance.  The patient is coming to the emergency department with complaints of  more or the same.  She reports that she has not taken her medications  for over month's time.  Her initial presenting complaint to the ER was  for cough and insomnia; however, on further interrogation, she does  report that she is having some worsening dyspnea with exertion.  She  denies any PND, orthopnea, or lower extremity edema.  Denies any chest  pain or discomfort.  She denies any over-the-counter or illicit drug use  as the etiology for her insomnia, but I cannot pinpoint why she cannot  sleep.  States that it might be related to her shortness of breath.   PAST MEDICAL HISTORY:  1. Nonischemic cardiomyopathy with EF approximately 30% on      echocardiogram on November 2009.  2. Hypertension.  3. Anemia.  4. Chronic renal insufficiency with creatinine (1.2-1.6).   SOCIAL HISTORY:  She lives in [place] with stepfather.  Ten-pack-year  history of smoking.  No alcohol.  Denies any illicit drug use.   FAMILY HISTORY:  Reviewed and is noncontributory with the patient's  current medical conditions.   ALLERGIES:  No known drug allergies.   MEDICATIONS:  Non-compliant with current medications; she is supposed to  be on Lasix 40 mg b.i.d., enalapril 10 mg b.i.d., spironolactone 25 mg a  day, and Coreg 12.5  mg b.i.d.   REVIEW OF SYSTEMS:  Negative 11-point review of systems except for those  dictated in the above HPI   PHYSICAL EXAMINATION:  VITAL SIGNS:  Her blood pressure on initial  presentation was 184/125 with improvement to 150/106 with labetalol  administration.  Her heart rate was initially in the 140s and now 108.  She is afebrile.  GENERAL:  Well-developed, well-nourished, African American female in no  acute distress.  HEENT:  Moist mucus membranes.  No scleral icterus.  No conjunctival  pallor.  NECK:  Supple with full range of motion.  Jugular venous pressure  estimated approximately 20 cm of water.  CARDIOVASCULAR:  Tachycardic.  Regular rate and rhythm with a soft 2/6  holosystolic murmur to left lower sternal border.  CHEST:  Clear to auscultation bilaterally.  No wheezes, rales, or  rhonchi.  ABDOMEN:  Soft, nontender, nondistended.  Normoactive bowel sounds.  EXTREMITIES:  No peripheral edema.  Pulses are 2+ bilaterally.  MUSCULOSKELETAL:  No joint deformity or effusions.  SKIN:  No rashes.  No lesions.  NEUROLOGIC:  Nonfocal.  Cranial nerves II-XII are grossly intact.   LABORATORY DATA:  Her chest x-ray demonstrated cardiomegaly with some  mild cephalization  of vessels; however, no overt heart failure is  noted.  EKG is not performed in the emergency department.  Laboratory  data significant for a hemoglobin of 10.7.  BUN and creatinine of 19 and  1.25.  Biomarkers have not been checked in the emergency department.  Her BNP was 1658.   IMPRESSION:  1. Acute on chronic left ventricular systolic failure secondary to      medication noncompliance.  2. Hypertension.  3. Chronic renal insufficiency.  4. Chronic anemia.   PLAN:  Admit to telemetry monitoring.  We will administer 80 mg of IV  Lasix to diurese her.  Restart her beta-blocker and ACE inhibitor now as  well as spironolactone.  We will check an EKG and follow cardiac  biomarkers as well as check a utox  given her insomnia and hypertension  with tachycardia on presentation.  Illicit drugs may be playing a role  in this admission as well.  She can follow up with Dr. Anne Fu in the  morning.      Vernice Jefferson, MD  Electronically Signed     JT/MEDQ  D:  03/23/2008  T:  03/24/2008  Job:  [id number]

## 2010-08-30 NOTE — H&P (Signed)
[name], [name]              ACCOUNT NO.:  [id number]   MEDICAL RECORD NO.:  [id number]          PATIENT TYPE:  INP   LOCATION:  2920                         FACILITY:  [facility]   PHYSICIAN:  Della Goo, M.D. DATE OF BIRTH:  [birth year]   DATE OF ADMISSION:  04/03/2008  DATE OF DISCHARGE:                              HISTORY & PHYSICAL   PRIMARY CARE PHYSICIAN:  Unassigned.   CHIEF COMPLAINT:  Fever, shortness of breath, and cough.   HISTORY OF PRESENT ILLNESS:  This is a 40 year old female who presents  to the emergency department with complaints of worsening cough,  shortness of breath, and fevers over the past 3 days.  She reports  coughing up yellowish mucus.  She also states that she has been having  chest and back pain secondary to increased coughing.  Her symptoms have  been worsening.  She states that she has had increasing shortness of  breath as well but denies having any wheezing.  She denies having any  nausea or vomiting.  Denies having head congestion.  Denies having any  other myalgias and also denies having loss of appetite.  The patient has  multiple medical problems which include nonischemic cardiomyopathy with  an ejection fraction of 30% on 2-D echo November 2009, also history of  hypertension and chronic renal insufficiency, as well as anemia and the  patient reports being off her medications for 1 month secondary to not  being able to afford her medications and losing her medical insurance.  The patient states that she received a letter from her previous  physician, Dr. Caro Hight  which stated day with no longer be able to see her  since she no longer had insurance.   PAST MEDICAL HISTORY:  As mentioned above.   MEDICATIONS:  Lasix 40 mg one p.o. b.i.d., potassium 20 mEq one p.o.  b.i.d., Vasotec 10 mg one p.o. b.i.d., Coreg 12.5 mg one p.o. b.i.d.,  and Aldactone 25 mg one p.o. daily.   ALLERGIES:  Sulfa.   SOCIAL HISTORY:  The patient is a smoker  and reports smoking one half to  one pack daily.  She denies any alcohol or illicit drug usage.   FAMILY HISTORY:  Positive for mother who had hypertension and possible  multiple myeloma.   REVIEW OF SYSTEMS:  Pertinents are mentioned above.  All other organ  systems are negative.   PHYSICAL EXAMINATION:  Physical examination findings:  This is a 37-year-  old overweight female in discomfort but no acute distress.  VITAL SIGNS:  Temperature 101.4, blood pressure 172/126, heart rate 136,  respirations 28, O2 saturations 99-100%.  HEENT: Examination normocephalic, atraumatic.  Pupils equally round and  reactive to light.  Extraocular movements are intact. Funduscopic  benign.  Oropharynx is clear.  NECK: Supple with full range of motion.  No thyromegaly, adenopathy, or  jugular venous distention.  CARDIOVASCULAR:  Tachycardiac rate and rhythm.  No murmurs, gallops or  rubs.  LUNGS:  Decreased breath sounds, occasional rhonchi.  ABDOMEN: Positive bowel sounds, soft, nontender, nondistended.  EXTREMITIES:  Without cyanosis, clubbing or edema.  Neurologic examination  nonfocal.   LABORATORY STUDIES:  White blood cell count 13.5, hemoglobin 11.0,  hematocrit 34.6, platelets 325, neutrophils 82% lymphocytes 7%, MCV  85.9.  Sodium 133, potassium 4.0, chloride 102, carbon dioxide 22, BUN  19, creatinine 1.45 and glucose 118.  Urinalysis negative except for  greater than 300 total protein and small urine hemoglobin.  Beta  natriuretic peptide 1189.0.  Chest x-ray reveals a right lower lobe  pneumonia and cardiomegaly.   ASSESSMENT:  A 40 year old female being admitted with:  1. Right lower lobe pneumonia.  2. Nonischemic cardiomyopathy with congestive heart failure syndrome.  3. Hypertension.  4. Sinus tachycardia.   PLAN:  The patient will be placed on IV antibiotic therapy of Rocephin  and azithromycin to treat for in a community acquired pneumonia along  with nebulizer treatments and  supplemental oxygen therapy.  The patient  will also be placed on IV Lasix q.12h. to promote diuresis and will be  restarted on her regular medications.  DVT and GI prophylaxis will also  be ordered and further workup will ensue pending results of the  patient's clinical course and her studies.      Della Goo, M.D.  Electronically Signed     HJ/MEDQ  D:  04/04/2008  T:  04/04/2008  Job:  [id number]

## 2010-08-30 NOTE — Consult Note (Signed)
[name], [name]              ACCOUNT NO.:  [id number]   MEDICAL RECORD NO.:  [id number]          PATIENT TYPE:  INP   LOCATION:  6526                         FACILITY:  [facility]   PHYSICIAN:  Jake Bathe, MD      DATE OF BIRTH:  March 19, 1971   DATE OF CONSULTATION:  07/09/2007  DATE OF DISCHARGE:                                 CONSULTATION   REASON FOR CONSULTATION:  [name] is being seen at the request of  Dr. Suanne Marker for the evaluation of acutely decompensated systolic  dysfunction, heart failure.   HISTORY OF PRESENT ILLNESS:  A 40 year old female known well to me from  a prior admission in January 2009 with return to hospital secondary to  dyspnea.  She had acutely decompensated systolic heart failure in  January 2009 with an ejection fraction of 25% to 30%, left ventricular  hypertrophy and had severely elevated blood pressures at that time with  diastolics above 100.  After several days within the hospital including  an adenosine challenge to help break her tachycardia which was not  successful, she felt better and was placed on Coreg 25 mg twice a day,  lisinopril 20 mg twice a day, maintenance dose Lasix of 20 mg twice a  day, and BiDil three times a day (recently stopped).  I had her  scheduled for a 2-week followup, and during that appointment she  admitted to medical noncompliance.  Blood pressure was quite elevated.   She is eager to leave [place] to go to [place] with a job  at [employer] which hopefully was going to be starting next week.   She has had trouble sleeping.  Over the past several days increased  cough, orthopnea.  She states that she has not been taking her medicines  because they are too strong.   PAST MEDICAL HISTORY:  1. Systolic left ventricular failure, EF 25% to 30% - HIV negative,      TSH normal.  No firm diagnosis of peripartum cardiomyopathy, but      states that she had an enlarged heart several years after her last  pregnancy.  2. Mild mitral regurgitation.  3. Left ventricular hypertrophy.  4. Hypertension - Severe, noncompliant with medications.  During past      hospitalization, we were able to decrease blood pressure to the 140      systolic range on meds as listed below.  5. Tachycardia - Not a reentrant circuit given failed adenosine      challenge.  Likely sinus tachycardia, was slowed to the 90s during      last hospitalization on carvedilol.   ALLERGIES:  No known drug allergies.   MEDICATIONS:  She is supposed to be taking,  1. Lisinopril 20 mg twice a day.  2. BiDil 3 times a day (recently stopped).  3. Furosemide 20 mg twice a day.  4. Klor-Con 10 mEq a day.  5. Carvedilol 25 mg twice a day.  6. Ester-C.  7. Tylenol Sleep.   Currently here she was started on Lasix 40 mg IV q. 8 hours.  Lovenox  and several of her antihypertensive medications were held.   FAMILY HISTORY:  Mother died recently here of cancer but no early family  history of coronary artery disease or cardiomyopathy.   SOCIAL HISTORY:  Smokes half-pack per day.  Occasionally drinks alcohol,  works at [employer].  Used to walk frequently up until January when her  systolic dysfunction was diagnosed.   REVIEW OF SYSTEMS:  No bleeding.  No syncope.  Positive for cough,  orthopnea, shortness of breath, trouble sleeping.  Unless stated above  all other 12 review of systems negative.   PHYSICAL EXAMINATION:  VITAL SIGNS: Temperature 99.0, pulse 120s to 130,  respirations 30 to 20, blood pressure currently 153/76.  In the  emergency department, blood pressure was 180/95, saturating 99% on 2 L.  GENERAL: Ill-appearing, in mild respiratory distress sitting up in bed,  tired.  EYES: Pale conjunctivae.  EOMI.  No scleral icterus.  HEENT: Moist mucous membranes.  NECK: Thick.  Unable to appreciate JVD.  No carotid bruits.  CARDIOVASCULAR: Tachycardiac, regular rhythm, positive S3, soft systolic  murmur.  LUNGS: Bilateral  lower lobe crackles.  ABDOMEN: Soft, obese, positive bowel sounds, nontender, and no bruits.  EXTREMITIES:Three plus to four plus pitting edema, bilateral lower  extremities.  NEUROLOGIC: Tired appearing, no tremors, able to transfer from bed to  wheelchair without difficulty.  SKIN: No rashes.  Warm and dry.   DATA:  ECG from emergency department shows tachycardia rate 132.  Will  review.  Previous ECG demonstrated sinus tachycardia with normal  intervals and PVCs.  Prior echocardiogram from January 2009 showed  ejection fraction 25% to 30%, diffuse hypokinesis, mild mitral  regurgitation, dilated inferior vena cava, trivial pericardial effusion,  and left ventricular hypertrophy.  Creatinine during last  hospitalization was 1.2.  At discharge, BNP was 248 and weight was 199  pounds on clinic visit May 29, 2007.  Current weight 86.5  kilograms.  Output is negative 550 mL since last night.  Chest x-ray  personally reviewed shows cardiomegaly; pulmonary edema, especially  worse in the bases.  Prior medical records reviewed.   LABS:  CBC:  White count 16.3, hemoglobin 9.3, hematocrit 29.2, and  platelet count 435.  Urine culture from last night shows no growth.  Lipid profile shows total cholesterol of 111, HDL 21, LDL 80,  triglycerides 48, sodium 140, potassium 3.6, CO2 29, BUN 17, creatinine  1.44 up from 1.2 on discharge.  AST 55, ALT 85 mildly elevated.  Albumin  2.4, low; CK 437, MB 8.6, troponin 0.1, D-dimer 5.1, BNP 751, ANA was  negative from May 19, 2007.  HIV negative or nonreactive.  TSH  normal.  Iron was low at 10 with ferritin being 86, normal.  Urine drug  screen this admission was negative.  Prior medical records reviewed.   ASSESSMENT/PLAN:  A 40 year old female with acutely decompensated  systolic heart failure, chronic renal insufficiency, severe  hypertension, dyspnea, elevated cardiac biomarkers, and edema seen on  chest x-ray.  1. Acutely decompensated  heart failure - Etiology of left ventricular      systolic function, most likely hypertension.  The patient has never      had cardiac catheterization, as she refused on prior admission.  We      will restart Coreg at half dose of 12.5 mg twice a day to help with      her tachycardia as well as blood pressure control.  We will also      restart lisinopril  given that she needs afterload reduction in the      setting of heart failure.  Renal insufficiency will be monitored.      Has been able to tolerate this well in the past.  In addition, I      agree with Lasix 40 mg IV q. 8 hours, may need to increase this for      optimal diuresis of 1 to 2 L per day.  BNP is elevated from prior      admission.  In regards to her cardiomyopathy workup, HIV is      negative, thyroid is normal, ANA is negative.  2. Tachycardia - Likely sinus tachycardia, tried adenosine in the      past, no reentrant circuit, restart carvedilol.  3. Dyspnea - With elevated D-dimer, she is getting a V/Q scan and      lower extremity Dopplers.  Likely, D-dimer may be elevated in the      setting of acute heart failure decompensation.  4. Mildly elevated cardiac biomarkers - Likely secondary to acutely      decompensated heart failure and not acute coronary syndrome.  5. Hypertension - Restarting several of her medications as listed      above.  6. Insomnia - Agree with psychiatric referral.  May be secondary to      orthopnea or worsened heart failure.  7. Poor medical compliance - Has not been taking her medications      correctly.  We will follow along with you.      Jake Bathe, MD  Electronically Signed     MCS/MEDQ  D:  07/09/2007  T:  07/10/2007  Job:  [id number]   cc:   Kela Millin, M.D.

## 2010-08-30 NOTE — Discharge Summary (Signed)
NAMENORETA, [name]              ACCOUNT NO.:  [id number]   MEDICAL RECORD NO.:  [id number]          PATIENT TYPE:  INP   LOCATION:  3035                         FACILITY:  [facility]   PHYSICIAN:  Pramod P. Pearlean Brownie, MD    DATE OF BIRTH:  November 12, 1970   DATE OF ADMISSION:  05/03/2008  DATE OF DISCHARGE:  05/07/2008                               DISCHARGE SUMMARY   DISCHARGE DIAGNOSES:  1. Right middle cerebral artery branch infarct, felt to be embolic      secondary to nonischemic cardiomyopathy, status post IV tPA and      angiogram without intervention.  2. Hypertension.  3. Equivocal history of sleep apnea.  4. Asthma.  5. Nonischemic cardiomyopathy following pneumonia within the past 6      months.  6. Anemia.  7. Chronic renal insufficiency.  8. Mild hypokalemia.  9. Noncompliance.   DISCHARGE MEDICINES:  1. Lasix 40 mg b.i.d.  2. K-Dur 20 mEq b.i.d.  3. Vasotec 10 mg b.i.d.  4. Coreg 12.5 mg b.i.d.  5. Aldactone 25 mg a day.  6. Aspirin 325 mg a day.  7. Benadryl 25 mg nightly for sleep at bedtime.   STUDIES PERFORMED:  1. CT of the brain on admission shows no acute abnormality.  2. MRI of the brain shows a 2.2-cm area of cortical-based acute      infarct involving the right anterior operculum and the right      anterior temporal tip with a small area involving the right      occipital lobe, few scattered nonspecific changes in the      subcortical white matter supratentorially.  3. Cerebral angiogram shows occlusion of the superior division of the      right MCA in M2 and M3 regions, 50% stenosis of the left common      carotid artery at the cervical petrous junction.  4. A 2-D echocardiogram shows EF of 25% with severe diffuse left      ventricular hypokinesis, akinesis of the entire anterior septal      wall.  No embolus found with that.  5. Transcranial Doppler performed, results pending.  6. EKG shows sinus rhythm with first-degree AV block, bilateral  enlargement, incomplete left bundle-branch block, T-wave      abnormality, consider inferolateral ischemia, prolonged QT.   LABORATORY STUDIES:  Hypercoagulable panel, antithrombin III normal,  protein C low at 62, protein C functional low at 74, protein S 100,  total and functional 85.  Lupus anticoagulant not detected.  Homocystine  9.9.  Cardiolipin antibody, I think it is normal and I think beta 2-  glycoprotein is also normal.  Negative for factor V Leiden.  Negative  protein.  Negative prothrombin II gene mutation.  ANA negative.  C4 is  25, C3 is 131.  Syphilis nonreactive.  Sed rate 18.  Homocystine 9.8.  Lipid profile with cholesterol 129, triglycerides 71, HDL 16, and LDL  99.  Cardiac enzymes negative.  BNP elevated at 837.  Chemistry with  potassium 3.1, glucose 106, BUN 24, and creatinine 1.42.  Urine  microscopic, 0-2 white  blood cells, few squamous epithelial, and few  bacteria.  Coag studies with PT 15.9, otherwise normal.  CBC with  hemoglobin 10.9, hematocrit 34.7 and RDW 19.4.  Urine pregnancy is  negative.   HISTORY OF PRESENT ILLNESS:  [name] is a 40 year old African  American female who presented to the emergency department with above  secondary to complaints to be unable to sleep for 2 weeks.  She also  reports having increased swelling in both her lower legs which has been  building up over her abdominal area.  She reports having mild shortness  of breath.  Denies any cough, congestion, fevers, or chills.  Has a  history of nonischemic cardiomyopathy with EF of 30% as of November  2009.  She also has a history of hypertension and chronic renal  insufficiency and of noncompliance.  She was last hospitalized on  April 03, 2008, with the right lower lobe pneumonia and congestive  heart failure syndrome.  She has not been taking her medicines since  discharge.   In the emergency room, the patient was found to have hypertensive  urgency and congestive  heart failure with BNP of 837.  Plans were to  admit her to the Intensive Care Unit and placed her on a Cardene drip.  On way from the emergency department to ICU, the patient developed acute  onset left upper extremity hemiparesis and a code stroke was called at  [facility].  The patient was found to have acute neurologic changes,  was given IV tPA, and transferred to [facility] for further  evaluation.   HOSPITAL COURSE:  Cerebral angiogram was performed which showed thrombus  in M1 and M2 region of the superior division of the right MCA.  As the  patient improved clinically, there was no further intervention required.  She was admitted to the Neuro ICU on Cardene.  The patient has been  noncompliance with her medications for some time.  Her home medicines  were resumed in the hospital and she was weaned off the Cardene drip.  She does have a significant family history of stroke in sort of young  age in her family.  Her possible etiology of stroke with low EF, age and  hereditary could play an important part and a hypercoagulable panel was  drawn.  Her stroke was obviously that of an embolic nature.  She was  placed on aspirin for secondary stroke prevention as she is not a  Coumadin candidate due to her noncompliance.  She has been asked to  follow up with her primary care physician, her cardiologist, and  [facility] in 2 months.  She was evaluated by PT and OT and  felt to have no need.   CONDITION ON DISCHARGE:  The patient is alert and oriented x3.  No  aphasia.  No dysarthria.  She has no focal neurologic deficits except  for some mild left fine motor movement in the left.   DISCHARGE/PLAN:  1. Discharge home with family.  2. Aspirin for secondary stroke prevention.  3. Continuation of home medicines.  4. The patient has to qualify for Medicaid after this current      admission.  5. Follow up with Dr. Delia Heady in 2 months.  6. Follow up with  cardiologist within 1 month.  7. Follow up with primary care physician within 1 month.      Annie Main, N.P.    ______________________________  Sunny [name]. Pearlean Brownie, MD  SB/MEDQ  D:  05/07/2008  T:  05/08/2008  Job:  [id number]   cc:   Jake Bathe, MD

## 2010-08-30 NOTE — H&P (Signed)
[name], SPADONI              ACCOUNT NO.:  [id number]   MEDICAL RECORD NO.:  [id number]            PATIENT TYPE:   LOCATION:                                 FACILITY:   PHYSICIAN:  Michiel Cowboy, MD    DATE OF BIRTH:   DATE OF ADMISSION:  07/08/2007  DATE OF DISCHARGE:                              HISTORY & PHYSICAL   PRIMARY CARE PHYSICIAN:  Simone Curia, MD, and Deatra James, M.D.   This is a 40 year old female with a history of medical noncompliance,  heart failure, and recent admission for heart failure exacerbation and  pneumonia.  The patient is a very poor historian.  What I can gather  from what she tells me, since her last discharge, she came to see Dr.  Anne Fu and told him that she felt that she did not need to be treated  for heart failure but rather only for pneumonia which was making her  short of breath.  She was feeling that the heart failure medications  were making her feel poorly and worsening her edema and asked them to be  decreased.  Even though doses were decreased somewhat, she was still  having worsening shortness of breath and eventually discontinued them  altogether at which point she became significant short of breath and  ended up calling EMS last night.  She was brought into the emergency  department with florid pulmonary edema.  She was give 80 mg of Lasix to  which she diuresed a great deal and is currently feeling much better.  The patient still states that she does not understand why we are  treating her heart failure, and she just feels that this is  probably  pneumonia . She does recall there was something wrong with her heart  after the birth of her child in 2001 but has not followed up with  cardiology for that.   REVIEW OF SYSTEMS:  Otherwise, Review of Systems negative for chest  pain.  Positive for shortness of breath.  Otherwise unremarkable.  The  patient does endorse that she has not slept for 14 days straight.   ALLERGIES:   SULFA.   MEDICATIONS:  The patient is not currently taking any medications.  Per  review of records, she was discharged on:  1. Lisinopril 20 mg p.o. b.i.d.  2 . BiDil 20/37.5 mg p.o. b.i.d.  1. Carvedilol 25 mg p.o. b.i.d.  2. Lasix 20 mg b.i.d.   Of note, the patient has not been taking any of her medications.   SOCIAL HISTORY  continues to smoke occasionaly, no EtOH, no drugs   FAMILY HISTORY  unremarkable   PHYSICAL EXAMINATION:  VITAL SIGNS:  Blood pressure 171/103, heart rate  136, temperature 98.5, respirations 24, O2 saturation 97% on 2 liters.  GENERAL:  Currently appears to be in no acute distress after diuresis.  Seems to be having some degree of pressured speech.  LUNGS:  Occasional crackles at the bases.  HEART:  Tachycardic. No murmur could be ascertained.  EXTREMITIES:  Lower extremities show 3+ edema up to the thighs.  ABDOMEN:  Obese but nontender, nondistended.  NEUROLOGIC:  Intact.   LABORATORY DATA:  White blood cell count 15.6, hemoglobin 10, platelets  472. Creatinine 1.22.  D-dimer elevated at 5.16.  LFTs somewhat elevated  SGOT up to 112.  Albumin 2.8.  BNP elevated at 757.   EKG showing tachycardia with questionable P waves.  Per review of old  records, the patient has had a similar pattern in the past in January  2009.  No significant change from prior EKG.   Chest x-ray shows bilateral pulmonary edema.   ASSESSMENT AND PLAN:  This is a 40 year old female with history of  noncompliance and congestive heart failure.   1 . Congestive heart failure.  Will restart her discharge medications .  Will need to consult cardiology in the morning.  Dr. Anne Fu is the one  who has seen the patient in the past.  Will start her 80 mg Lasix IV  b.i.d. for now.  Strict I's and O's, daily weights, and orthostatics.  Repeat chest x-ray and BNP in the morning for followup.  Also, the  patient has had a partial CHF workup in the past, but will obtain HIV  and TSH.   Will defer to cardiology for further evaluation needed to be  done such as catheterization.  We will place patient on telemetry and  cycle cardiac enzymes, although she denies any history of chest pain.  1. Elevated D-dimer and tachycardia.  Tachycardia does seem to be      somewhat chronic, but nonetheless, the patient was started on full-      dose Lovenox twice a day in the emergency department.  We will      obtain lower extremity Dopplers and VQ scan in the morning and      continue Lovenox b.i.d. for now since patient does seem to be at      high risk for clots given relative poor mobility and recent      hospitalization.  2. Anemia.  Probably chronic but will follow with card of stool and      anemia panel.  Could contribute to tachycardia.  For now, no      indication for transfusion with hemoglobin above 10.  3. Pressured speech.  Will obtain a toxicology panel.  If persists,      may need to have psychiatry evaluate the patient, especially given      this history of not sleeping for 14 days.  4. Elevation of liver function tests, likely secondary to congestive      hepatopathy but nonetheless, will obtain liver ultrasound.  5. Chronic renal insufficiency.  Seems to be at baseline of around 1.2      to 1.3.  Will obtain renal ultrasound.  6. Prophylaxis.  Protonix.  Patient given Lovenox.  7. Given elevated white blood cell count, will evaluate UA and urine      culture.  Will repeat chest x-ray in the morning to see if there is      any evidence of pneumonia underlying the pulmonary edema.      Michiel Cowboy, MD  Electronically Signed     AVD/MEDQ  D:  07/08/2007  T:  07/08/2007  Job:  [id number]   cc:   Tyrell Antonio, M.D.  Jake Bathe, MD

## 2010-08-30 NOTE — Discharge Summary (Signed)
[name], [name]              ACCOUNT NO.:  [id number]   MEDICAL RECORD NO.:  [id number]          PATIENT TYPE:  INP   LOCATION:  3707                         FACILITY:  [facility]   PHYSICIAN:  Corinna L. Lendell Caprice, MDDATE OF BIRTH:  January 01, 1971   DATE OF ADMISSION:  07/08/2007  DATE OF DISCHARGE:  07/13/2007                               DISCHARGE SUMMARY   DISCHARGE DIAGNOSES:  1. Congestive heart failure exacerbation secondary to noncompliance.  2. Urinary tract infection.  3. Cough, suspect acute bronchitis.  4. Elevated liver function tests secondary to congestive hepatopathy.  5. Chronic renal insufficiency.  6. Sinus tachycardia.  7. Resolved hypokalemia.  8. Left ventricular dysfunction with echocardiogram done in January      2009, showing ejection fraction of 25% to 30%.  9. Anemia, most likely secondary to menorrhagia.   DISCHARGE MEDICATIONS:  1. Lisinopril 20 mg p.o. b.i.d.  2. Coreg 25 mg p.o. b.i.d.  3. Lasix 40 mg a day.  4. Potassium chloride 20 mEq a day.  5. Iron sulfate 325 mg a day.  6. Digoxin 0.125 mg daily.   DIET:  Should be low salt.   ACTIVITY:  Ad lib.   FOLLOWUP:  Appointment was set up with Dr. Anne Fu on Friday, July 29, 2007, at 2:15 p.m., however, the patient doubts that she will make this  as she is moving to [place].  I encouraged her to follow up with  primary care physician and cardiologist of her choice as soon as  appointment available.   CONDITION ON DISCHARGE:  Stable.   CONSULTATIONS:  Jake Bathe, MD.   PROCEDURES:  None.   PERTINENT LABORATORY AND X-RAY DATA:  Initial white blood count was  15,000 with 93% neutrophils and 4% lymphocytes.  Hemoglobin initially  10.0, hematocrit 31, MCV 81, and platelet count of 472.  At discharge,  her white count is 6.2, hemoglobin 8.9, hematocrit 27.7, reticulocyte  count normal.  INR on admission 1.3, PTT 34, D-dimer 5.16.  Initial  complete metabolic panel is significant for a  creatinine of 1.22; at  discharge, her creatinine is 1.5.  Her initial albumin was 2.8, SGOT 63,  SGPT 112, hemoglobin A1c 6.3 with normal serum blood glucose in the  hospital.  Her potassium dropped to 3.2 on July 10, 2007, and was  adequately repleted.  CPK on admission 394, MB-fraction of 11.9, index  3.0, troponin 0.11, BNP 751, LDL 80, triglycerides 48, HDL 21, TSH  1.874, iron 13, TIBC 341, B12, and folate were normal.  Ferritin 232.  Urine pregnancy negative.  Urine drug screen negative.  Urinalysis  showed moderate leucocyte esterase, rare squamous epithelial cells.  Negative nitrites.  Moderate blood, 11-20 white cells, 11-20 red cells,  few bacteria.  Urine culture, however, was negative.   SPECIAL STUDIES/RADIOLOGY:  EKG showed sinus tachycardia with a rate of  132 and poor R-wave progression.  Two views of the chest showed marked  cardiomegaly and bilateral air space disease, unchanged, right greater  than the left.  V-Q scan showed a large regional profusion defect on the  right  lower lobe likely secondary to air space disease or effusion, low  probability for pulmonary embolus.  Abdominal ultrasound showed small  left pleural effusion, trace fluid in Morison pouch, otherwise  unremarkable.   HISTORY AND HOSPITAL COURSE:  [name] is a 40 year old black female  who was diagnosed with congestive heart failure several months prior to  this admission.  She had an echocardiogram showing ejection fraction of  25% to 30% at that time and was started on Coreg, Lasix, and ACE  inhibitor.  She stopped taking her medications because that made her  feel ill, and she did not actually believe she had heart failure, but  rather pneumonia.  She presented with cough, shortness of breath.  She  was difficult historian.  Her legs were more swollen.  Please see H&P  for complete admission details.   Her blood pressure was 171/103, heart rate 136; otherwise, normal vital  signs.  She had  rales on exam.  Edema 3+ up to the thighs, and a chest x-  ray consistent with congestive heart failure.   She was started back on ACE inhibitor, Coreg, Lasix, and BiDil was  stopped.  The patient also had a leucocytosis and cough, as well as  urine dipsticks suggestive of urinary tract infection.  She was started  on Levaquin.  By the time of discharge, her edema had resolved.  Her  shortness of breath was improved.  Her cough was improved, and she had  clear breath sounds.  Her heart rate at the time discharge is ranging  about 90-100.  Compliance was encouraged.  Dr. Anne Fu had mentioned that  the patient would at some point benefit from cardiac catheterization,  but felt that this was most likely secondary to hypertension.  Also, she  gave a sketchy history of congestive heart failure during pregnancy and  felt it may be related to postpartum cardiomyopathy.  She is encouraged  to follow up with Dr. Anne Fu, but reports that she will be moving for  her job next week and may not make it.   She was noted to be anemic and reported heavy periods.  She was started  on iron.  She was also started on digoxin for her heart rate.   Total time on the day of discharge is 35 minutes.      Corinna L. Lendell Caprice, MD  Electronically Signed     CLS/MEDQ  D:  07/13/2007  T:  07/13/2007  Job:  [id number]   cc:   Jake Bathe, MD  Deatra James, M.D.

## 2010-08-30 NOTE — Discharge Summary (Signed)
NAMEDAJANA, [name]              ACCOUNT NO.:  [id number]   MEDICAL RECORD NO.:  [id number]          PATIENT TYPE:  INP   LOCATION:  4731                         FACILITY:  [facility]   PHYSICIAN:  Jake Bathe, MD      DATE OF BIRTH:  1971-04-05   DATE OF ADMISSION:  03/23/2008  DATE OF DISCHARGE:  03/26/2008                               DISCHARGE SUMMARY   DISCHARGE DIAGNOSES:  1. Acute on chronic systolic heart failure, resolved.  2. Renal insufficiency, improved.  3. Medical noncompliance.  4. Nonischemic cardiomyopathy, EF 70%  on echo as of  November 2009.  5. Hypertension.  6. Anemia.   HOSPITAL COURSE:  [name] is a 40 year old female who is now seen  with cardiomyopathy admitted multiple times for medication  noncompliance.  She has been told multiple times about her heart  condition and still does not take her medications.  She was admitted on  March 23, 2008,with heart failure secondary to medication  noncompliance.  She also has a history of hypertension, chronic renal  insufficiency, and chronic anemia.   She was diuresed heavily with IV form and then switched over to an oral  diurese.  Her medications were restarted and she lost significant amount  of weight.  Her discharge weight was 88 kg.   Her BUN was 27, creatinine 1.51.  Sodium 139, potassium 3.8.  BNP 659,  down from 1658.   She has been discharged to home in stable and improved condition on the  following medications;  1. Aldactone 25 mg a day.  2. Coreg 12.5 mg twice a day.  3. Vasotec 10 mg twice a day.  4. Potassium 20 mEq twice a day.  5. Lasix 40 mg twice a day.   She is to remain on a low-sodium, heart healthy, low-salt diet.  Increase activity slowly.  Followup with her primary MD in 2 weeks.  Because of her medical noncompliance, she will not  be following up with  Dr. Anne Fu unless it is  within 30-day window for which she needs  emergency care.      Guy Franco, P.A.      Jake Bathe, MD  Electronically Signed    LB/MEDQ  D:  03/26/2008  T:  03/27/2008  Job:  [id number]   cc:   Jake Bathe, MD

## 2010-08-30 NOTE — Consult Note (Signed)
NAMEDERRICK, ORRIS              ACCOUNT NO.:  [id number]   MEDICAL RECORD NO.:  [id number]          PATIENT TYPE:  INP   LOCATION:  4734                         FACILITY:  [facility]   PHYSICIAN:  Antonietta Breach, M.D.  DATE OF BIRTH:  1970-08-29   DATE OF CONSULTATION:  04/07/2008  DATE OF DISCHARGE:                                 CONSULTATION   REQUESTING PHYSICIAN:  [facility].   REASON FOR CONSULTATION:  Rule out depression as a factor in the  patient's noncompliance with general medical care.   Ms. Depp is a 40 year old female admitted to the [facility] on  April 03, 2008, for right lower lobe pneumonia.   The patient has had some decreased energy with her pneumonia.  However,  she does not have any depressed mood, lack of interest.  She does have  constructive future goals and interest.  She has no thoughts of harming  herself or others.  She has no delusions or hallucinations.   Her memory and orientation function are intact.  She is compliant with  her medical care in the hospital.   There is concern by the general medical team that she has been  noncompliant with her general medical care over the past 3 months which  has resulted in an exacerbation of her congestive heart failure in the  context of chronic renal disease and also has contributed to the  development of her pneumonia.   The patient insists that she has been compliant with her general medical  care.  However, she does cite some confounding factors.   She recently has had to move from [place] after losing her job  there.  She is back in [place] depending upon extended family to help  take care of her two children, the oldest one being 3.  One of the  fathers does help to support locally, the other father is no longer  involved.   PAST PSYCHIATRIC HISTORY:  No history of increased energy or decreased  need for sleep.  No history of suicide attempts.  No history of major  depression.   No history of specific psychiatric care although she was given an  anxiety pill approximately 10 years ago during a high stress time.  The identity of this medication is not recalled by the patient.   FAMILY PSYCHIATRIC HISTORY:  None known.   SOCIAL HISTORY:  As above, the patient does not use any alcohol or  illegal drugs.  She is applying for as much government aid as she can  receive at this time.  She does desire to regain work.   PAST MEDICAL HISTORY:  As above.   REVIEW OF SYSTEMS:  Noncontributory.   MENTAL STATUS EXAM:  Ms. Dismore is alert.  She is oriented to all  spheres.  Her eye contact is good.  Attention span is normal.  Concentration within normal limits.  Affect broad and appropriate.  Mood  within normal limits.  She is oriented to all spheres.  Her memory  function is intact for immediate, recent and remote.  Fund of knowledge  and intelligence  within normal limits.  Speech involves normal rate and  prosody without dysarthria.  Thought process is logical, coherent, goal-  directed.  No looseness of associations.  Thought content, no thoughts  of harming herself, no thoughts of harming others, no delusions, no  hallucinations.  Insight is intact.  Judgment is intact.   ASSESSMENT:  AXIS I:  No diagnosis, possible history of anxiety disorder  not otherwise specified now resolved.  AXIS II:  Deferred.  AXIS III:  See past medical history.  AXIS IV:  General medical, primary support group, economic,  occupational.  AXIS V:  55.   Ms. Decoste does not present evidence of a psychiatric disorder which is  confounding her ability to follow general medical advice and regimen.   She does not have any need for psychotropic medication.  Given her  circumstances she could potentially benefit from a counseling regimen  for helping with coping skills and stress management.  However, this is  not mandatory.   She does agree to call emergency services for  any psychiatric or general  medical emergency.   She describes her constructive plans of general medical followup after  leaving the hospital including [facility].      Antonietta Breach, M.D.  Electronically Signed     JW/MEDQ  D:  04/08/2008  T:  04/08/2008  Job:  [id number]

## 2010-08-30 NOTE — Op Note (Signed)
NAMEEVELLYN, TUFF              ACCOUNT NO.:  [id number]   MEDICAL RECORD NO.:  [id number]          PATIENT TYPE:  INP   LOCATION:  3035                         FACILITY:  [facility]   PHYSICIAN:  Noralyn Pick. Eden Emms, MD, FACCDATE OF BIRTH:  19-Nov-1970   DATE OF PROCEDURE:  05/06/2008  DATE OF DISCHARGE:                               OPERATIVE REPORT   A 40 year old patient of Dr. Anne Fu and Dr. Eldridge Dace, known  cardiomyopathy with CHF admitted to the hospital with CVA.  TEE was done  to rule out CVA source of embolus.   The patient was sedated with 75 mcg of fentanyl and 5 mg of Versed.  Using digital technique, an Omniplane probe was advanced into the  esophagus without incident.  There was moderate to severe left jugular  cavity enlargement.  There was LVH, EF was 25-30% with global  hypokinesis, mitral valve was mildly thickened with mild MR.  There was  biatrial enlargement.  Right ventricle was mildly dilated.  Aortic valve  was trileaflet and normal.  There was no PFO.  Bubble study was negative  for right-to-left cardiac shunt.  There were some late bubbles  indicating possibility of a pulmonary shunt.  There was significant  spontaneous contrast in both atria which were dilated.  The left atrial  appendage also had spontaneous contrast, but there was no formed  thrombus.  Imaging of the aorta was somewhat suboptimal.   FINAL IMPRESSION:  1. Severe left jugular cavity enlargement with left ventricular      hypertrophy, global hypokinesis, ejection fraction 25-30%.  No      mural apical thrombus.  2. Biatrial enlargement.  3. Spontaneous contrast in left atrial appendage, but no formed      thrombus.  4. Bubble study negative for right-to-left cardiac shunt, late bubbles      passing suggesting possibility of a small pulmonary shunt.  5. Normal aortic valve.   The patient tolerated the procedure well.  She will follow up with Dr.  Anne Fu for any further cardiology  needs.      Noralyn Pick. Eden Emms, MD, [facility]  Electronically Signed     PCN/MEDQ  D:  05/06/2008  T:  05/06/2008  Job:  [id number]

## 2010-08-30 NOTE — Consult Note (Signed)
NAMEMORRISON, MASSER              ACCOUNT NO.:  [id number]   MEDICAL RECORD NO.:  [id number]          PATIENT TYPE:  INP   LOCATION:  1411                         [facility]   PHYSICIAN:  Antonietta Breach, M.D.  DATE OF BIRTH:  [birth year]   DATE OF CONSULTATION:  05/21/2008  DATE OF DISCHARGE:                                 CONSULTATION   REQUESTING PHYSICIAN:  [facility].   HISTORY OF PRESENT ILLNESS:  [name] is a 40 year old female  admitted to the [facility] on May 18, 2008, for  congestive heart failure.   [name] continues to experience regular feeling on edge, muscle  tension and excessive worry.  She does not have any thoughts of harming  herself or others.  She has no hallucinations or delusions.  Her  orientation and memory function are intact.   Also she has constructive future goals and interests.  She does have  difficulty with sleep.   She has had acute stress of general medical exacerbation.  Please see  below.   PAST PSYCHIATRIC HISTORY:  [name] was initially treated for anxiety  approximately 8 years ago with a medication that worked quickly.  She  cannot remember what the name of the medication was, however, it worked  the same day.   She has no history of increased energy or decreased need for sleep.  She  has no history of hallucinations or suicide attempts.  She has never  been admitted to a psychiatric hospital.  She has never had direct  psychiatric care.   FAMILY PSYCHIATRIC HISTORY:  None known.   SOCIAL HISTORY:  [name] was let go during the recession from her  job.  She was working in Clinical biochemist.  She has two children, ages  41 and 38.  She is a single mother.  She does not use alcohol or illegal  drugs.  She has a Chief Operating Officer in [employer]   PAST MEDICAL HISTORY:  1. History of cerebral infarcts involving the right middle cerebral      artery.  2. Hypertension.  3. Sleep  apnea.  4. Nonischemic cardiomyopathy.  5. Anemia.  6. Chronic renal insufficiency.   MEDICATIONS:  The MAR is reviewed.   ALLERGIES:  SHE IS ALLERGIC TO SULFA.   Psychotropic medications currently; Seroquel 50 mg nightly, Ambien 5 mg  nightly p.r.n.   A TSH was performed in December which was normal.   REVIEW OF SYSTEMS:  Noncontributory.   PHYSICAL EXAMINATION:  VITAL SIGNS:  Temperature 98.3, pulse 95,  respiratory rate 18, blood pressure 146/98.  O2 saturation on room air  93%.  Weight 90 kg.   MENTAL STATUS EXAM:  [name] is a middle-aged female sitting up in  her hospital bed with no abnormal involuntary movements.   She is alert with good eye contact.  Her affect is mildly anxious.  Her  mood is mildly anxious.  She is oriented to all spheres.  Her memory is  intact to immediate, recent and remote.  Her fund of knowledge and  intelligence are normal.  Speech is within normal limits.  Thought  process logical, coherent and goal-directed.  No looseness of  associations.  Thought content; no thoughts of harming herself or  others.  No delusions or hallucinations.  Insight is intact.  Judgment  is intact.   ASSESSMENT:  Axis I:  293.84, anxiety disorder, not otherwise specified.  [name] does have a history of generalized anxiety disorder.  She  also has had cerebral infarcts, therefore, there may be a general  medical component.  Axis II:  None.  Axis III:  See past medical history.  Axis IV:  General medical, primary support group, economic.  Axis V:  Global Assessment of Functioning 55.   [name] is not at risk to harm herself or others.  She agrees to  call emergency services immediately for any thoughts of harming herself  or others or distress.   She agrees to not drive if drowsy.   The undersigned provided ego supportive psychotherapy and education.   The indications, alternatives and adverse effects of Celexa and Xanax  were discussed with the  patient, including the risk of Xanax dependence.   [name] understands and wants to proceed as below.   RECOMMENDATIONS:  1. Start Celexa 10 mg p.o. q.a.m. and then increase as tolerated to 20      mg q.a.m. in 4 days.  2. Discontinue Seroquel.  3. Xanax 0.25-1 mg p.o. t.i.d. p.r.n. anxiety, as well as insomnia.      Would utilize the Ambien for now if the patient still had insomnia      1 hour after her evening Xanax and would caution about her history      of sleep apnea.  4. Would continue ego support.  5. Would ask the social worker to set [name] up with outpatient      psychiatric follow-up, as well as outpatient counseling.  She would      be an excellent candidate for cognitive behavioral therapy combined      with      deep breathing and progressive muscle relaxation.  6. Outpatient psychiatric follow-up can be found at one the clinics      attached to [facility], [facility] or [facility].      There is also the [facility].      Antonietta Breach, M.D.  Electronically Signed     JW/MEDQ  D:  05/21/2008  T:  05/22/2008  Job:  [id number]

## 2010-08-30 NOTE — H&P (Signed)
[name], [name]              ACCOUNT NO.:  [id number]   MEDICAL RECORD NO.:  [id number]          PATIENT TYPE:  INP   LOCATION:  4709                         FACILITY:  [facility]   PHYSICIAN:  Carlena Hurl, MDDATE OF BIRTH:  [birth year]   DATE OF ADMISSION:  07/19/2008  DATE OF DISCHARGE:                              HISTORY & PHYSICAL   CHIEF COMPLAINT:  Cough with rusty-colored sputum, fever, and pleuritic  type of chest pain with shortness of breath of 1-week duration.   HISTORY OF PRESENT ILLNESS:  This is a 40 year old African American  female who has a past medical history significant for nonischemic  cardiomyopathy with ejection fraction of 25%, history of obstructive  sleep apnea, hypertension, and bronchial asthma, coming in today with 1-  week history of cough, fever, shortness of breath.  The history is  obtained by the patient.  According to her, she started having cough for  the past 1 week and bringing up rusty-colored sputum.  She stated it  looks like a dark blood, and she was also having a pleuritic type of  chest pain whenever she coughs.  And she also was febrile at home but  never checked her temperature.  She was also having little shortness of  breath since the cough has been started.  She was admitted for CHF  exacerbation and was discharged to home around June 23, 2008.  According  to this patient, she was admitted a couple of times in the past for  pneumonia.  This patient denies having any sick contacts, any travel  history recently.  She denies vomiting, abdominal pain, constipation,  diarrhea, weight loss.  She was feeling little nauseous for the past few  days.   PAST MEDICAL HISTORY:  Significant for nonischemic cardiomyopathy with  ejection fraction of 25-30%.  According to the recent May 06, 2008,  history of hypertension, history of sleep apnea, history of right middle  cerebral artery branch infarct, status post IV TPN angiogram  without  intervention, history of anemia, history of chronic renal insufficiency,  history of medication noncompliance.   ALLERGIES:  The patient is allergic to SULFA.   SOCIAL HISTORY:  The patient states she is an ex-smoker and stopped  recently, says she smoked for about 10 years 1 pack per day.  Denies  alcohol, IV drug abuse.   FAMILY HISTORY:  The patient says her mother has history of hypertension  but does not remember any other history significant in the family.   REVIEW OF SYSTEMS:  Pretty much the same as the history of present  illness.   MEDICATIONS THAT THE PATIENT IS ON AT HOME:  Lasix, Coreg, lisinopril,  potassium chloride.   PHYSICAL EXAMINATION:  GENERAL:  This is a 40 year old African American  female who is sitting on the bed comfortably without severe shortness of  breath or without severe chest pain.  VITAL SIGNS:  When she came to the ER, blood pressure 191/130 which  later ran down to 186/132 and by the time I saw this patient, her blood  pressure is 170/90; pulse rate of 130; respiratory  rate 22; temperature  99.4; saturating 100% on room air.  HEENT:  Head is atraumatic, normocephalic.  Pupils, PERRLA.  Tympanic  membranes intact.  No discharge from eyes or ears.  NECK:  Supple.  No JVD appreciated.  No goiter palpated.  LUNGS:  There is decreased air entry present in the right upper and left  middle lobe but no significant wheezing is appreciated.  No significant  bronchitis appreciated.  CVS:  S1 and S2 heard with increased heart rate.  No murmurs or rubs  appreciated.  ABDOMEN:  Soft.  Bowel sounds present, nontender, nondistended.  EXTREMITIES:  No pedal edema noted.  Pulses palpable bilaterally.  No  cyanosis or clubbing.   LABS:  Hemoglobin of 11.7, hematocrit 35.8, platelets of 227.  WBC of  7.9, d-dimer of 0.74, sodium 135, potassium 3.6, chloride 104, bicarb  23, glucose 94, creatinine of 1.21,  first set of troponin I less than  0.03,  first set of CK-MB 1.4.  Urine pregnancy is negative, and the  patient had a small amount of blood in the urine with protein greater  than 300, otherwise insignificant, and the patient had a wet prep done;  vaginal cultures which showed no yeast and no Trichomonas and very few  clue cells are present.  The patient had x-rays.  The patient had a CT  chest done, which showed no pulmonary emboli but worsening of ground-  glass opacity toward the right lung and within the left lung with some  areas of sparing in the left upper lobe.  Development of more patchy  consolidation throughout the regions of ground-glass opacity suggesting  bronchopneumonia.   ASSESSMENT AND PLAN:  A 40 year old female with a history of recurrent  pneumonias in the past and a history of congestive heart failure, coming  in with 1-week history of cough with rusty-colored sputum and fever at  home and pleuritic type of chest pain, now CT chest showing bilateral  pneumonia.  1. Recurrent pneumonia/hospital-acquired pneumonia, as this patient      was recently discharged in the second week of March, so at this      time, we will treat this as a hospital-acquired pneumonia and start      this patient on cefepime and vancomycin through pharmacy to dose      at, and if the patient's clinical status does not improve, she may      need a Pulmonology consult for further evaluation of her recurrent      pneumonia.  The patient has been saturating 100% so far, and she      will be given albuterol and Atrovent breathing treatments.  2. Hypertensive urgency.  The patient's blood pressure was really high      when she came in initially 191/130 and by the time I saw the      patient's blood pressure came down to 170/90.  The patient has a      history of significant medical noncompliance and does not take      medications according to the old chart, and patient says that she      is taking medications, so at this time we are going  to restart all      the medications that she has been on at home that is Vasotec,      Coreg, and Lasix.  We will also give her labetalol and hydralazine      as needed for blood pressures greater than 180/100.  3. History of nonischemic cardiomyopathy.  Clinically, the patient is      stable, saturating fine, and there is no JVD and no crackles at the      bases of the lungs, and we will get a BMP on this patient, and we      will get her inputs and outputs everyday and check her weights      daily.  4. History of sleep apnea.  The patient is saturating fine, so we will      continue to monitor her saturations.  5. History of anemia.  Today, the patient's hemoglobin is 11.72,      around 9 in the last admission that was February of 2010, so we      will continue to monitor her H and H.  6. History of chronic kidney disease.  The patient's creatinine has      been stable at 1.21, which is usually her baseline.  We will      continue to monitor the serum creatinine, electrolytes.  7. History of medical noncompliance.  The patient has been educated      regarding taking all her medications, and the patient has agreed to      that.  For deep venous thrombosis prophylaxis, the patient will be      given heparin 5000 units subcu 3 times a day.  For gastrointestinal      prophylaxis, the patient will be given Protonix 40 mg p.o. 1 time a      day.      Carlena Hurl, MD  Electronically Signed     JD/MEDQ  D:  07/20/2008  T:  07/20/2008  Job:  [id number]

## 2010-08-30 NOTE — Discharge Summary (Signed)
NAMEZELPHA, MESSING              ACCOUNT NO.:  [id number]   MEDICAL RECORD NO.:  [id number]          PATIENT TYPE:  INP   LOCATION:  4734                         FACILITY:  [facility]   PHYSICIAN:  Theodosia Paling, MD    DATE OF BIRTH:  [birth year]   DATE OF ADMISSION:  04/03/2008  DATE OF DISCHARGE:  04/08/2008                               DISCHARGE SUMMARY   PRIMARY CARE PHYSICIAN:  The patient does not have a PCP; however, she  will be following with Dr. Anne Fu for her cardiac condition and  hypertension.   ADMITTING HISTORY:  Please refer to the excellent admission note  dictated by Dr. Della Goo under history of present illness.   DISCHARGE DIAGNOSES:  1. Systolic compensated congestive heart failure.  2. Pneumonia.  3. Chronic kidney disease.  4. Normocytic and normochromic anemia.   DISCHARGE MEDICATIONS:  The patient is instructed to continue the  following medications,  1. Vasotec 10 mg p.o. q.12 hours.  2. Coreg 12.5 mg p.o. q.12 hours.  3. Diazepam 10 mg p.o. nightly p.r.n.  4. Ambien 5 mg p.o. nightly p.r.n.   MEDICATIONS STOPPED:  Potassium.   MEDICATIONS ADJUSTED:  1. Lasix is adjusted to 40 mg p.o. daily.  2. New medication added aspirin enteric-coated 81 mg p.o. daily.   HOSPITAL COURSE:  Following issues were addressed during the  hospitalization;  1. Systolic congestive heart failure.  The patient was initially      admitted.  First issue will be right lower lobe pneumonia.  The      patient was initially started on Rocephin and erythromycin.  The      patient received inpatient care for pneumonia.  She did not have      any leukocytosis or respiratory distress, tolerated antibiotics      very well.  At the time of discharge, she was afebrile with normal      WBC.  2. Nonischemic cardiomyopathy with congestive heart failure syndrome.      [facility] was consulted, Dr. Anne Fu has seen her in the past      who recommended to decrease the dose  of Lasix and continue ACE      inhibitor and stopping Aldactone.  The patient will be following as      an outpatient with Dr. Anne Fu.  She was evaluated for intracardiac      defibrillator, which the patient refused.  She underwent an      echocardiogram evaluation on April 04, 2008 which showed      severely decreased LV ejection fraction to 30% with moderate      diffuse left ventricular hypokinesia.  3. Chronic kidney disease.  The patient had a baseline creatinine.      She would be getting followup BMP in 3-4 days as an outpatient.      She did not develop any electrolyte or volume disturbances      throughout her hospitalization.  4. Anemia.  Her hemoglobin stayed stable during the hospitalization      with no indication for blood transfusion.   CONSULTATIONS:  1. Consultation  performed by [facility] on April 05, 2008,      for nonischemic cardiomyopathy.  2. Consultation performed by Dr. Antonietta Breach to evaluate      depression as the factor in the patient's noncompliance of      medications.   PROCEDURE PERFORMED:  None.   IMAGING PERFORMED:  Chest x-ray done on April 03, 2008 showing right  lower lobe pneumonia.  Repeat chest x-ray done on April 05, 2008  showing a questionable right lung effusion and mild cardiomegaly.  Echocardiogram performed on April 04, 2008 showing ejection fraction  of 30% with moderate diffuse left ventricular hypokinesia and akinesis  of the entire anterior septal wall.   DISPOSITION:  The patient will follow with Dr. Anne Fu in 3-5 days' time  with followup of BMP and cardiac issue as well.   TOTAL TIME SPENT ON DISCHARGE:  45 minutes.      Theodosia Paling, MD  Electronically Signed     NP/MEDQ  D:  06/23/2008  T:  06/24/2008  Job:  [id number]   cc:   Jake Bathe, MD

## 2010-08-30 NOTE — H&P (Signed)
NAMESEE, BEHARRY              ACCOUNT NO.:  [id number]   MEDICAL RECORD NO.:  [id number]          PATIENT TYPE:  INP   LOCATION:  1232                         [facility]ACILITY:  [facility]   PHYSICIAN:  Richarda Overlie, MD       DATE OF BIRTH:  [birth year]   DATE OF ADMISSION:  05/18/2008  DATE OF DISCHARGE:                              HISTORY & PHYSICAL   CHIEF COMPLAINT:  Shortness of breath, insomnia.   SUBJECTIVE:  This is a 40 year old female with a history of  hypertension, sleep apnea, asthma, nonischemic cardiomyopathy with the  most recent ejection fraction of 25-30%, recent history of right middle  cerebral artery blunt infarct who presented to the ER with a chief  complaint of shortness of breath, significant orthopnea, bilateral  dependent edema in both legs for the last 3-4 days.  The patient was  recently hospitalized and discharged on January 21 after right middle  cerebral artery branch infarct which was thought to be secondary to  embolic stroke.  She has a history of nonischemic cardiomyopathy and had  a transesophageal echo on May 06, 2008, that showed an ejection  fraction of 25-30%, but no mural apical thrombus.  She did have biatrial  enlargement and a bubble study was negative for left-to-right cardiac  shunt.  The patient also had a cerebral angiogram that showed 50%  stenosis of the left common carotid artery and occlusion of the superior  division of the right MCA.  Today, she presents with symptoms suggestive  of congestive heart failure.  She does state that she has been compliant  with some of her medicines, not all of them and has experienced  significant orthopnea with a nonproductive cough for the last 2-4 days.  The patient was found to be hypertensive with systolic blood pressure in  the 190s in the ER.  She denied any chest pain, palpitations, syncope,  presyncope, lightheadedness.  She denies any symptoms of unilateral  weakness or slurred speech.  She  still has some numbness in the right  side of her face which is not new, possibly a residue of symptoms from  her CVA 10 days back.  Otherwise, she has had no difficulty with gait or  ambulation.  She was placed on aspirin for secondary stroke prevention  and was thought to be not a candidate for Coumadin because of  noncompliance.   PAST MEDICAL HISTORY:  1. Right middle cerebral artery branch infarcts status post IV TPN      angiogram without intervention.  2. Hypertension.  3. Sleep apnea.  4. Nonischemic cardiomyopathy with an ejection fraction 25-30% status      post transesophageal echo on May 06, 2008.  5. Anemic.  6. Chronic renal insufficiency.  7. History of medication noncompliance.   ALLERGIES:  SULFA.   SOCIAL HISTORY:  The patient is a smoker, smoked one half to one pack of  cigarettes per day.  Denies use of alcohol, illicit drugs.   FAMILY HISTORY:  Positive for hypertension in the mother and the mother  also possibly has multiple myeloma.   REVIEW OF  SYSTEMS:  As in HPI.   PHYSICAL EXAMINATION:  VITAL SIGNS:  Blood pressure 190/130, pulse 123,  respirations 22, 99% on 2 liters.  HEENT:  Pupils equal and reactive.  Extraocular movements intact.  GENERAL:  Patient appears to be in mild to moderate respiratory  distress.  LUNGS:  Bibasilar wheezing and crackles at the bases.  CARDIOVASCULAR:  Tachycardia but regular rate and rhythm.  No  appreciable murmurs, rubs or gallops.  ABDOMEN:  Soft, nontender, nondistended.  EXTREMITIES: pitting bilateral lower extremity edema 2+.   LABORATORY DATA:  EKG shows normal sinus rhythm with a rate of 120 beats  per minute.  Chest x-ray shows cardiomegaly with pulmonary vascular  congestion, bibasilar atelectasis.  CT scan of the head without contrast  shows no acute intracranial findings on CT scan.  WBC is 0.8, hemoglobin  10.0, hematocrit 32.0, platelet count of 375.  Sodium 137, potassium  3.5, chloride 100, bicarb  27, BUN 15, creatinine 1.16, troponin 0.06.  CK total 131 BNP 1270.   ASSESSMENT/PLAN:  1. Acute on chronic systolic congestive heart failure.  2. Hypertensive urgency.  3. History of nonischemic cardiomyopathy.  4. Anemia, stable.  5. History of tobacco abuse.   PLAN:  The patient will be admitted to the ICU for hypertensive urgency.  Will initiate the patient on nitro drip that was initiated in the ER for  her hypertensive urgency for after load reduction in the setting of her  acute and chronic congestive heart failure.  Also, initiate IV Lasix  therapy to help with diuresis.  Continue with Coreg 12.5 mg  p.o. q. 12  and vasotec 10 mg p.o. q. 12.  In the setting of the patient's sinus  tachycardia, she also needs to be evaluated for pulmonary embolism.  Will obtain a D-dimer.  If the D-dimer is elevated, the patient will  probably need a CT angio or VQ scan.  Repeat cardiac echo is not being  done because of recent transesophageal echo.  A portable chest x-ray  will be repeated in the morning to evaluate for CHF.  If the D-dimer is  elevated, the patient will be scheduled for a CT angio and a duplex  ultrasound of her lower extremities.  Will repeat a BNP in the morning.  No evidence of infiltrate or pneumonia on cxr .  The patient's white  count is normal.  Lower suspicion for pneumonia despite the patient's  continued tobacco use.  The patient's TSH was checked on December 16 and  was found to be within normal limits, and hence, this not being  repeated.  She is a full code.  She will be monitored in ICU.      Richarda Overlie, MD  Electronically Signed     NA/MEDQ  D:  05/19/2008  T:  05/19/2008  Job:  [id number]

## 2010-08-30 NOTE — Discharge Summary (Signed)
[name], Sarah              ACCOUNT NO.:  [id number]   MEDICAL RECORD NO.:  [id number]          PATIENT TYPE:  INP   LOCATION:  3708                         FACILITY:  [facility]   PHYSICIAN:  Jake Bathe, MD      DATE OF BIRTH:  1971/03/28   DATE OF ADMISSION:  03/01/2008  DATE OF DISCHARGE:  03/06/2008                               DISCHARGE SUMMARY   DISCHARGE DIAGNOSES:  1. Acute on chronic systolic heart failure, improved.  2. Acute renal insufficiency, improved.  3. Iron deficiency anemia, on replacement therapy.  4. Hypertension, stable.  5. Pneumonia, treated.  6. Chronic anemia.   HOSPITAL COURSE:  [name] is a 40 year old female with a history of  congestive heart failure who has had several admissions for such.  She  complains of hemoptysis, blood-streaked sputum, and fever.  This has  been going on 3 days after her hospitalization.  She complains of  fatigue and shortness of breath.  She does have PND and orthopnea.  Her  chest x-ray showed bilateral airspace disease and possible pneumonia.  Her BNP was 694.  She was admitted with congestive heart failure with  possible pneumonia.   She was treated with IV antibiotics for her pneumonia.  She was treated  aggressively with IV diuresis for her congestive heart failure.  She was  aggressively treated over the next several days, and her dry weight  prior to her discharge was 77 kg.   By March 06, 2008, she was felt to be safe to go home.   Her lab studies during her hospitalization included sodium 133,  potassium 3.6, BUN 22, and creatinine 1.69.  BNP had decreased to 200.  Her cardiac enzymes were essentially negative.  Her troponin was  elevated at 0.10, which was felt to be secondary to her heart failure.  Hemoglobin 9.3, hematocrit 29.3, platelets 281, and white count 8.3.  Portable chest x-ray showed no significant change in CHF, but she was  greatly diuresed from that point on.   DISCHARGE  MEDICATIONS:  1. Coreg 12.5 mg 1 p.o. b.i.d.  2. Potassium 20 mEq 1 p.o. b.i.d.  3. Lasix 40 mg 1 p.o. b.i.d.  4. Iron 325 mg 1 a day.  5. Lisinopril 20 mg a day.   The patient is to remain on a low-sodium, heart-healthy diet.  She is  restricted to 1500 mL per 24 hours.  Increase activity slowly.  Follow  up with primary care physician or cardiologist in 1 month.  Unfortunately, she has been highly medically non-compliant.  She even at  times denies that has any heart pathology dispite multiple attempts to  educate her.      Guy Franco, P.A.      Jake Bathe, MD  Electronically Signed    LB/MEDQ  D:  05/04/2008  T:  05/05/2008  Job:  [id number]   cc:   Jake Bathe, MD

## 2010-08-30 NOTE — H&P (Signed)
NAMEKEIR, [name]              ACCOUNT NO.:  [id number]   MEDICAL RECORD NO.:  [id number]          PATIENT TYPE:  INP   LOCATION:  0105                         FACILITY:  [facility]   PHYSICIAN:  Kela Millin, M.D.DATE OF BIRTH:  05/21/1970   DATE OF ADMISSION:  05/15/2007  DATE OF DISCHARGE:                              HISTORY & PHYSICAL   CHIEF COMPLAINT:  Persistent cough and shortness of breath.   HISTORY OF PRESENT ILLNESS:  The patient is a 40 year old, black female  with history significant for tobacco abuse and pneumonia in the past  (2001), who presents with above complaints.  She states that she was in  her usual state of health until about 1 month ago when she developed a  cough and because it was not getting any better, she saw Dr. Hyacinth Meeker in  the clinic on January 4.  At that time, she was started on a Z-Pak as  well as albuterol MDIs.  She states that she completed the 5-day course  of the Z-Pak and has been using the albuterol, but she has continued to  have cough, productive of brownish sputum.  She also has continued to  have dyspnea on exertion which has been worsening.  She denies leg  swelling, chest pain, hematemesis, diarrhea, melena and no hematochezia.  She also admits to subjective fevers for the past 1 month and she has  been having cold sweats at night.  She denies dysuria.   [name] was seen in the emergency room and a chest x-ray was done  which revealed a right lower lobe pneumonia with cardiomegaly and no  evidence of CHF.  Her other lab work revealed a white cell count of 13.3  and a potassium of 3.2.  She is admitted for further evaluation and  management.  The patient states that in 2001, when she was diagnosed  with a pneumonia, her workup included HIV testing which was negative at  the time.   PAST MEDICAL HISTORY:  As above.   MEDICATIONS:  Albuterol MDI p.r.n.   ALLERGIES:  No known drug allergies.   SOCIAL HISTORY:  Positive for  tobacco, half-a-pack per day, occasional  alcohol.   FAMILY HISTORY:  Her mother is deceased.  She had hypertension and  questionable multiple myeloma.   REVIEW OF SYSTEMS:  As per HPI.  Other review of systems negative.   PHYSICAL EXAMINATION:  GENERAL:  The patient is a middle-aged, black  female.  She is acutely ill-appearing, but in no respiratory distress.  VITAL SIGNS:  Her temperature is 99.3, blood pressure 178/110, initially  198/125, pulse is 128, initially 109, respiratory rate is 20, O2  saturations 98%.  HEENT:  PERRL, EOMI, dry mucous membranes.  No oral exudates.  NECK:  Supple, no adenopathy, no thyromegaly and no JVD.  LUNGS:  She has crackles in her right lung base, no wheezes.  CARDIOVASCULAR:  Tachycardic, normal S1-S2.  No S3 appreciated.  ABDOMEN:  Soft, bowel sounds present, nontender, nondistended.  No  organomegaly and no masses palpable.  EXTREMITIES:  No cyanosis and no edema.  NEUROLOGIC:  Alert and oriented x3.  Cranial nerves 2-12 grossly intact.  Nonfocal exam.   LABORATORY DATA AND X-RAY FINDINGS:  Chest x-ray as per HPI.  Her white  cell count is 13.3 with a hemoglobin of 11.2, hematocrit of 33, platelet  count of 296 and neutrophil count of 87%.  Sodium is 138 with a  potassium of 3.2, chloride 102, CO2 is 27, glucose is 97, BUN is 13 with  a creatinine of 1.18.   ASSESSMENT/PLAN:  1. Pneumonia, right lower lobe.  As discussed above, she is a smoker      with previous history of pneumonia.  Will place on empiric      antibiotics also nebulized bronchodilators as needed and      antitussives.  2. Elevated blood pressure with no prior history of hypertension.      Will manage as appropriate.  3. Hypokalemia.  Replace potassium.      Kela Millin, M.D.  Electronically Signed     ACV/MEDQ  D:  05/15/2007  T:  05/16/2007  Job:  [id number]   cc:   Vikki Ports, M.D.  Fax: [phone]

## 2010-09-02 NOTE — Discharge Summary (Signed)
[name], [name]              ACCOUNT NO.:  [id number]   MEDICAL RECORD NO.:  [id number]          PATIENT TYPE:  INP   LOCATION:  1441                         FACILITY:  [facility]   PHYSICIAN:  Kela Millin, M.D.DATE OF BIRTH:  1970-05-10   DATE OF ADMISSION:  05/15/2007  DATE OF DISCHARGE:  05/22/2007                               DISCHARGE SUMMARY   DISCHARGE DIAGNOSES:  1. Cardiomyopathy with acute systolic heart failure - ejection      fraction 25-30% per echocardiogram of May 17, 2007.  2. Right lower lobe pneumonia.  3. Malignant hypertension.  4. Iron-deficiency anemia.  5. Renal insufficiency.   PROCEDURE AND STUDIES:  Two-D echocardiogram - ejection fraction 25-30%  with diffuse hypokinesis, some mild mitral regurgitation and dilated IVC  with trivial posterior pericardial effusion.  Left ventricular  hypertrophy noted.   CONSULTATIONS:  Cardiology - Dr. Anne Fu   BRIEF HISTORY:  The patient is a 40 year old black female smoker with  previous history of pneumonia who presented with complaints of  persistent cough and shortness of breath.  She reported that she had  seen her primary care physician and was started on a Z-Pak and albuterol  MDIs.  Her dyspnea on exertion continued to worsen, and so she came to  the ER.  In the ER, she had a chest x-ray done which revealed a right  lower lobe pneumonia and cardiomegaly.  She was admitted for further  evaluation and management.   Please see the full admission history and physical of May 15, 2007  for the details of the admission physical exam as well as the laboratory  data.   HOSPITAL COURSE:  1. Right lower lobe pneumonia - upon admission, she was started on      empiric antibiotics, and she was also placed on expectorants and      bronchodilators.  Her white cell count was initially elevated at      13.3 but with these interventions, her leukocytosis resolved, and      she gradually improved.  Her last  white cell count prior to      discharge was 8.6.  2. Cardiomyopathy with acute systolic heart failure - the patient's      blood pressures were noted to be markedly elevated in the ER -      178/110, initially 195/125, and she denied any prior history of      hypertension, had dyspnea appeared to be out of proportion to the      pneumonia noted on chest x-ray and with the cardiomegaly noted on      chest x-ray, the brain atretic peptide was done which was noted to      be elevated at 228.  An echocardiogram was done to further      evaluate, and it revealed an ejection fraction of 25-30%.  The      patient was then placed on IV Lasix.  Serial cardiac enzymes were      done and cardiology consulted.  Dr. Anne Fu saw the patient and      ordered a TSH which came  back within normal limits.  A PPD was also      placed, and this was negative, and an ANA was done and this was      negative as well.  She was placed on lisinopril in addition to the      Lasix.  Dr. Anne Fu subsequently adjusted her medications, as she      was slow to improve.  She was changed from metoprolol to Coreg and      Corinne Ports was added.  She was also placed on a 1.5 liter fluid      restriction.  Just after her medications were changed, she insisted      that she wanted to go home.  Dr. Anne Fu saw her and talked with her      about the danger of leaving and to return to the ER if there was      worsening of her symptoms.  Dr. Anne Fu also indicated that the      patient was to have a cardiac cath at some point in the future, but      she indicated that she was going to leave town soon.  Dr. Anne Fu      scheduled her to follow up with him in 2 weeks.  3. Mild acute renal insufficiency - with diuresis, her creatinine went      from 1.18 with a BUN of 13 to 1.35 at its peak with a creatinine of      18.  Her creatinine continued to be monitored and on recheck it was      1.31.  She was to follow up with her primary care  physician for      continued monitoring of her creatinine and further management as      appropriate.  4. Malignant hypertension - as noted above, her blood pressures were      elevated on admission and this was sustained on monitoring in the      hospital.  She was placed on lisinopril as above and subsequently      the Coreg and Biedl added as above.  With this, her blood pressures      improved, and she was to follow up outpatient with cardiology and      her primary care physician.  5. Iron deficiency anemia - the impression was that this was secondary      to her periods.  She was placed on iron supplementation and was to      follow up with her primary care physician.   DISCHARGE MEDICATIONS:  1. Lisinopril 20 mg p.o. b.i.d.  2. Biedl 20/37.5 mg p.o. t.i.d.  3. KCl 10 mEq p.o. b.i.d.  4. Lasix 20 mg p.o. b.i.d.  5. Carvedilol 25 mg p.o. b.i.d.  6. Avelox 400 mg p.o. daily.  7. The patient to continue albuterol MI 2 puffs q.4-6h. p.r.n. as      previously.   FOLLOW-UP CARE:  1. Dr. Anne Fu in 2 weeks.  2. Dr. Theresia Lo, the patient to call and schedule appointment upon      discharge.   DISCHARGE CONDITION:  Stable.      Kela Millin, M.D.  Electronically Signed     ACV/MEDQ  D:  06/06/2007  T:  06/06/2007  Job:  [id number]   cc:   Vikki Ports, M.D.  Fax: [phone]   Jake Bathe, MD  Fax: [phone]

## 2010-11-09 ENCOUNTER — Telehealth: Payer: Self-pay | Admitting: Cardiology

## 2010-11-09 NOTE — Telephone Encounter (Signed)
All Cardiac faxed to [facility] @ [phone]  11/09/10/km

## 2011-01-05 LAB — BASIC METABOLIC PANEL
BUN: 13
CO2: 23
CO2: 23
Calcium: 8.2 — ABNORMAL LOW
Calcium: 8.3 — ABNORMAL LOW
Chloride: 101
Chloride: 102
Creatinine, Ser: 1.18
Creatinine, Ser: 1.22 — ABNORMAL HIGH
GFR calc Af Amer: 56 — ABNORMAL LOW
GFR calc Af Amer: >60
GFR calc non Af Amer: 46 — ABNORMAL LOW
GFR calc non Af Amer: 52 — ABNORMAL LOW
Glucose, Bld: 95
Glucose, Bld: 96
Glucose, Bld: 97
Potassium: 3.2 — ABNORMAL LOW
Potassium: 3.9
Sodium: 130 — ABNORMAL LOW
Sodium: 140

## 2011-01-05 LAB — CBC
HCT: 27.5 — ABNORMAL LOW
HCT: 33 — ABNORMAL LOW
Hemoglobin: 9.3 — ABNORMAL LOW
Hemoglobin: 9.8 — ABNORMAL LOW
MCHC: 33.8
MCHC: 34.4
MCV: 84.7
MCV: 85.5
MCV: 85.6
Platelets: 296
RBC: 3.22 — ABNORMAL LOW
RDW: 14.3
RDW: 14.6
RDW: 14.7
WBC: 13.3 — ABNORMAL HIGH

## 2011-01-05 LAB — DIFFERENTIAL
Basophils Absolute: 0
Basophils Absolute: 0
Eosinophils Absolute: 0.2
Eosinophils Relative: 1
Lymphocytes Relative: 9 — ABNORMAL LOW
Monocytes Absolute: 0.7
Neutro Abs: 10.5 — ABNORMAL HIGH
Neutrophils Relative %: 87 — ABNORMAL HIGH

## 2011-01-05 LAB — B-NATRIURETIC PEPTIDE (CONVERTED LAB)
Pro B Natriuretic peptide (BNP): 228 — ABNORMAL HIGH
Pro B Natriuretic peptide (BNP): 248 — ABNORMAL HIGH

## 2011-01-05 LAB — FOLATE: Folate: 10.9

## 2011-01-05 LAB — CARDIAC PANEL(CRET KIN+CKTOT+MB+TROPI): Troponin I: 0.18 — ABNORMAL HIGH

## 2011-01-05 LAB — IRON AND TIBC
Saturation Ratios: 4 — ABNORMAL LOW
TIBC: 272

## 2011-01-05 LAB — VITAMIN B12: Vitamin B-12: 318 (ref 211–911)

## 2011-01-06 LAB — CBC
HCT: 23.9 — ABNORMAL LOW
HCT: 25.8 — ABNORMAL LOW
HCT: 25.8 — ABNORMAL LOW
Hemoglobin: 8.1 — ABNORMAL LOW
Hemoglobin: 8.6 — ABNORMAL LOW
MCHC: 33.3
MCHC: 33.7
MCHC: 34
MCV: 84.3
MCV: 84.4
Platelets: 299
Platelets: 313
RBC: 3.01 — ABNORMAL LOW
RDW: 14.1
RDW: 14.5
RDW: 14.5

## 2011-01-06 LAB — COMPREHENSIVE METABOLIC PANEL
ALT: 134 — ABNORMAL HIGH
Alkaline Phosphatase: 105
BUN: 17
CO2: 24
GFR calc non Af Amer: 46 — ABNORMAL LOW
Glucose, Bld: 122 — ABNORMAL HIGH
Potassium: 3.7
Total Bilirubin: 0.7
Total Protein: 5.5 — ABNORMAL LOW

## 2011-01-06 LAB — BASIC METABOLIC PANEL
BUN: 14
BUN: 16
BUN: 18
CO2: 24
CO2: 27
Calcium: 8.2 — ABNORMAL LOW
Chloride: 105
Chloride: 105
Creatinine, Ser: 1.25 — ABNORMAL HIGH
Creatinine, Ser: 1.35 — ABNORMAL HIGH
GFR calc Af Amer: 54 — ABNORMAL LOW
GFR calc non Af Amer: 46 — ABNORMAL LOW
Glucose, Bld: 106 — ABNORMAL HIGH
Glucose, Bld: 110 — ABNORMAL HIGH
Potassium: 3.7
Potassium: 3.9
Sodium: 139

## 2011-01-06 LAB — B-NATRIURETIC PEPTIDE (CONVERTED LAB): Pro B Natriuretic peptide (BNP): 208 — ABNORMAL HIGH

## 2011-01-06 LAB — URINE CULTURE: Special Requests: POSITIVE

## 2011-01-06 LAB — URINALYSIS, ROUTINE W REFLEX MICROSCOPIC
Glucose, UA: NEGATIVE
Leukocytes, UA: NEGATIVE
pH: 5.5

## 2011-01-06 LAB — URINE MICROSCOPIC-ADD ON

## 2011-01-06 LAB — ANA: Anti Nuclear Antibody(ANA): NEGATIVE

## 2011-01-06 LAB — T4, FREE: Free T4: 1.36

## 2011-01-06 LAB — T3, FREE: T3, Free: 3.1 (ref 2.3–4.2)

## 2011-01-09 LAB — LIPID PANEL
HDL: 21 — ABNORMAL LOW
Total CHOL/HDL Ratio: 5.3
VLDL: 10

## 2011-01-09 LAB — FERRITIN: Ferritin: 232 (ref 10–291)

## 2011-01-09 LAB — COMPREHENSIVE METABOLIC PANEL
ALT: 112 — ABNORMAL HIGH
ALT: 85 — ABNORMAL HIGH
AST: 34
AST: 55 — ABNORMAL HIGH
Albumin: 2.4 — ABNORMAL LOW
Albumin: 2.8 — ABNORMAL LOW
Alkaline Phosphatase: 62
Alkaline Phosphatase: 71
BUN: 15
CO2: 31
Calcium: 8.6
Chloride: 96
Creatinine, Ser: 1.39 — ABNORMAL HIGH
GFR calc Af Amer: 52 — ABNORMAL LOW
GFR calc non Af Amer: 43 — ABNORMAL LOW
Glucose, Bld: 101 — ABNORMAL HIGH
Glucose, Bld: 102 — ABNORMAL HIGH
Potassium: 3.2 — ABNORMAL LOW
Potassium: 3.6
Sodium: 135
Sodium: 140
Total Bilirubin: 0.8
Total Protein: 5.8 — ABNORMAL LOW
Total Protein: 6.5

## 2011-01-09 LAB — BASIC METABOLIC PANEL
BUN: 18
BUN: 23
CO2: 31
Calcium: 8.5
Calcium: 9
Chloride: 100
Chloride: 97
Creatinine, Ser: 1.41 — ABNORMAL HIGH
Creatinine, Ser: 1.46 — ABNORMAL HIGH
GFR calc Af Amer: 45 — ABNORMAL LOW
GFR calc Af Amer: 49 — ABNORMAL LOW
GFR calc non Af Amer: 37 — ABNORMAL LOW
GFR calc non Af Amer: 39 — ABNORMAL LOW
GFR calc non Af Amer: 42 — ABNORMAL LOW
Glucose, Bld: 88
Glucose, Bld: 91
Potassium: 3.3 — ABNORMAL LOW
Potassium: 4.1
Potassium: 4.2
Sodium: 133 — ABNORMAL LOW
Sodium: 139
Sodium: 141

## 2011-01-09 LAB — URINALYSIS, ROUTINE W REFLEX MICROSCOPIC
Bilirubin Urine: NEGATIVE
Glucose, UA: NEGATIVE
Ketones, ur: NEGATIVE
Protein, ur: NEGATIVE

## 2011-01-09 LAB — TROPONIN I: Troponin I: 0.11 — ABNORMAL HIGH

## 2011-01-09 LAB — DIFFERENTIAL
Basophils Absolute: 0.1
Basophils Relative: 0
Eosinophils Absolute: 0.4
Eosinophils Relative: 3
Lymphocytes Relative: 7 — ABNORMAL LOW
Lymphs Abs: 0.7
Monocytes Absolute: 0.4
Monocytes Relative: 3
Neutro Abs: 14.5 — ABNORMAL HIGH
Neutrophils Relative %: 93 — ABNORMAL HIGH

## 2011-01-09 LAB — B-NATRIURETIC PEPTIDE (CONVERTED LAB)
Pro B Natriuretic peptide (BNP): 321 — ABNORMAL HIGH
Pro B Natriuretic peptide (BNP): 722 — ABNORMAL HIGH
Pro B Natriuretic peptide (BNP): 751 — ABNORMAL HIGH

## 2011-01-09 LAB — CBC
HCT: 26.9 — ABNORMAL LOW
HCT: 27.7 — ABNORMAL LOW
Hemoglobin: 10 — ABNORMAL LOW
Hemoglobin: 8.9 — ABNORMAL LOW
Hemoglobin: 9.3 — ABNORMAL LOW
MCHC: 32.3
MCHC: 32.4
MCV: 81
Platelets: 439 — ABNORMAL HIGH
Platelets: 472 — ABNORMAL HIGH
RBC: 3.32 — ABNORMAL LOW
RDW: 16.8 — ABNORMAL HIGH
RDW: 16.9 — ABNORMAL HIGH
RDW: 17 — ABNORMAL HIGH
RDW: 17 — ABNORMAL HIGH
WBC: 14.1 — ABNORMAL HIGH
WBC: 6.2

## 2011-01-09 LAB — RAPID URINE DRUG SCREEN, HOSP PERFORMED
Cocaine: NOT DETECTED
Opiates: NOT DETECTED

## 2011-01-09 LAB — URINE CULTURE: Colony Count: NO GROWTH

## 2011-01-09 LAB — FOLATE: Folate: 9.9

## 2011-01-09 LAB — POCT PREGNANCY, URINE: Preg Test, Ur: NEGATIVE

## 2011-01-09 LAB — CARDIAC PANEL(CRET KIN+CKTOT+MB+TROPI)
CK, MB: 8.6 — ABNORMAL HIGH
Relative Index: 1
Relative Index: 2
Troponin I: 0.09 — ABNORMAL HIGH

## 2011-01-09 LAB — CK TOTAL AND CKMB (NOT AT ARMC)
CK, MB: 11.9 — ABNORMAL HIGH
Total CK: 394 — ABNORMAL HIGH

## 2011-01-09 LAB — URINE MICROSCOPIC-ADD ON

## 2011-01-09 LAB — VITAMIN B12: Vitamin B-12: 816 (ref 211–911)

## 2011-01-09 LAB — TSH: TSH: 1.874

## 2011-01-16 LAB — PROTIME-INR
INR: 1.3
Prothrombin Time: 16.4 — ABNORMAL HIGH

## 2011-01-16 LAB — DIFFERENTIAL
Basophils Absolute: 0.1
Basophils Relative: 1
Basophils Relative: 1
Eosinophils Absolute: 0.2
Eosinophils Absolute: 0.2
Eosinophils Relative: 2
Eosinophils Relative: 2
Lymphs Abs: 1.5
Lymphs Abs: 1.5
Monocytes Absolute: 0.4
Monocytes Relative: 4
Neutrophils Relative %: 74
Neutrophils Relative %: 77

## 2011-01-16 LAB — COMPREHENSIVE METABOLIC PANEL
ALT: 60 — ABNORMAL HIGH
CO2: 26
Calcium: 8.4
Chloride: 101
GFR calc non Af Amer: 37 — ABNORMAL LOW
Glucose, Bld: 105 — ABNORMAL HIGH
Sodium: 136
Total Bilirubin: 0.8

## 2011-01-16 LAB — URINE MICROSCOPIC-ADD ON

## 2011-01-16 LAB — BASIC METABOLIC PANEL
CO2: 24
Chloride: 101
Creatinine, Ser: 1.53 — ABNORMAL HIGH
GFR calc Af Amer: 46 — ABNORMAL LOW
Potassium: 3.8

## 2011-01-16 LAB — CBC
HCT: 33.8 — ABNORMAL LOW
Hemoglobin: 9.2 — ABNORMAL LOW
MCHC: 32.2
MCHC: 32.6
MCV: 87.7
MCV: 87.9
RBC: 3.26 — ABNORMAL LOW
RBC: 3.85 — ABNORMAL LOW
WBC: 8.8
WBC: 9.6

## 2011-01-16 LAB — POCT CARDIAC MARKERS: Troponin i, poc: <0.05

## 2011-01-16 LAB — URINALYSIS, ROUTINE W REFLEX MICROSCOPIC
Glucose, UA: NEGATIVE
Leukocytes, UA: NEGATIVE
Protein, ur: 100 — AB
Specific Gravity, Urine: 1.031 — ABNORMAL HIGH
pH: 6

## 2011-01-16 LAB — CARDIAC PANEL(CRET KIN+CKTOT+MB+TROPI): CK, MB: 2.5

## 2011-01-17 LAB — BASIC METABOLIC PANEL
BUN: 20
BUN: 21
BUN: 24 — ABNORMAL HIGH
BUN: 25 — ABNORMAL HIGH
CO2: 25
CO2: 28
CO2: 28
CO2: 31
Calcium: 8.5
Calcium: 8.7
Calcium: 9
Calcium: 9
Calcium: 9.1
Calcium: 9.3
Chloride: 100
Chloride: 100
Chloride: 104
Chloride: 97
Creatinine, Ser: 1.37 — ABNORMAL HIGH
Creatinine, Ser: 1.58 — ABNORMAL HIGH
Creatinine, Ser: 1.7 — ABNORMAL HIGH
Creatinine, Ser: 1.73 — ABNORMAL HIGH
GFR calc Af Amer: 42 — ABNORMAL LOW
GFR calc Af Amer: 45 — ABNORMAL LOW
GFR calc Af Amer: 47 — ABNORMAL LOW
GFR calc Af Amer: 48 — ABNORMAL LOW
GFR calc Af Amer: 52 — ABNORMAL LOW
GFR calc non Af Amer: 35 — ABNORMAL LOW
GFR calc non Af Amer: 37 — ABNORMAL LOW
GFR calc non Af Amer: 40 — ABNORMAL LOW
GFR calc non Af Amer: 43 — ABNORMAL LOW
Glucose, Bld: 103 — ABNORMAL HIGH
Glucose, Bld: 134 — ABNORMAL HIGH
Glucose, Bld: 86
Potassium: 3.3 — ABNORMAL LOW
Potassium: 3.3 — ABNORMAL LOW
Potassium: 3.7
Potassium: 3.8
Potassium: 3.8
Sodium: 135
Sodium: 136
Sodium: 137
Sodium: 137
Sodium: 138
Sodium: 140

## 2011-01-17 LAB — CBC
HCT: 27.6 — ABNORMAL LOW
HCT: 28.7 — ABNORMAL LOW
Hemoglobin: 9.2 — ABNORMAL LOW
MCHC: 32
MCHC: 32.1
MCHC: 32.2
MCV: 88
MCV: 87.7
MCV: 88.4
MCV: 88.6
Platelets: 278
Platelets: 268
Platelets: 272
Platelets: 289
Platelets: 332
RBC: 3.4 — ABNORMAL LOW
RDW: 17.4 — ABNORMAL HIGH
RDW: 17.6 — ABNORMAL HIGH
RDW: 17.8 — ABNORMAL HIGH
WBC: 9.6
WBC: 11.1 — ABNORMAL HIGH
WBC: 6.3

## 2011-01-17 LAB — POCT I-STAT, CHEM 8
Chloride: 108
Creatinine, Ser: 1.4 — ABNORMAL HIGH
Glucose, Bld: 102 — ABNORMAL HIGH
HCT: 31 — ABNORMAL LOW
Hemoglobin: 10.5 — ABNORMAL LOW
Potassium: 4.5
Sodium: 138

## 2011-01-17 LAB — CARDIAC PANEL(CRET KIN+CKTOT+MB+TROPI)
CK, MB: 2.2
Relative Index: 1.1
Relative Index: 2.1
Relative Index: 2.2
Total CK: 102
Total CK: 249 — ABNORMAL HIGH
Troponin I: 0.1 — ABNORMAL HIGH
Troponin I: 0.11 — ABNORMAL HIGH

## 2011-01-17 LAB — URINALYSIS, ROUTINE W REFLEX MICROSCOPIC
Bilirubin Urine: NEGATIVE
Glucose, UA: NEGATIVE
Ketones, ur: NEGATIVE
Protein, ur: >300 — AB
pH: 6

## 2011-01-17 LAB — POCT CARDIAC MARKERS
CKMB, poc: 2.1
CKMB, poc: 2.2
Troponin i, poc: <0.05
Troponin i, poc: <0.05

## 2011-01-17 LAB — DIFFERENTIAL
Basophils Relative: 0
Eosinophils Absolute: 0.2
Lymphs Abs: 0.9
Monocytes Relative: 5
Neutro Abs: 8 — ABNORMAL HIGH
Neutrophils Relative %: 83 — ABNORMAL HIGH

## 2011-01-17 LAB — B-NATRIURETIC PEPTIDE (CONVERTED LAB): Pro B Natriuretic peptide (BNP): 694 — ABNORMAL HIGH

## 2011-01-17 LAB — RETICULOCYTES
Retic Count, Absolute: 136.5
Retic Ct Pct: 4.1 — ABNORMAL HIGH

## 2011-01-17 LAB — URINE MICROSCOPIC-ADD ON

## 2011-01-17 LAB — FERRITIN: Ferritin: 96 (ref 10–291)

## 2011-01-17 LAB — TSH: TSH: 2.039

## 2011-01-17 LAB — MAGNESIUM: Magnesium: 1.8

## 2011-01-17 LAB — CULTURE, BLOOD (ROUTINE X 2)

## 2011-01-17 LAB — IRON AND TIBC: TIBC: 325

## 2011-01-17 LAB — POCT PREGNANCY, URINE: Preg Test, Ur: NEGATIVE

## 2011-01-17 LAB — DIGOXIN LEVEL: Digoxin Level: <0.2 — ABNORMAL LOW

## 2011-01-17 LAB — FOLATE: Folate: >20

## 2011-01-20 LAB — CULTURE, BLOOD (ROUTINE X 2): Culture: NO GROWTH

## 2011-01-20 LAB — BASIC METABOLIC PANEL
BUN: 19 mg/dL (ref 6–23)
BUN: 27 mg/dL — ABNORMAL HIGH (ref 6–23)
BUN: 29 mg/dL — ABNORMAL HIGH (ref 6–23)
CO2: 22 mEq/L (ref 19–32)
CO2: 26 mEq/L (ref 19–32)
CO2: 29 mEq/L (ref 19–32)
CO2: 29 mEq/L (ref 19–32)
Calcium: 8.2 mg/dL — ABNORMAL LOW (ref 8.4–10.5)
Calcium: 8.7 mg/dL (ref 8.4–10.5)
Calcium: 8.9 mg/dL (ref 8.4–10.5)
Chloride: 103 mEq/L (ref 96–112)
Chloride: 103 mEq/L (ref 96–112)
Chloride: 98 mEq/L (ref 96–112)
Chloride: 98 mEq/L (ref 96–112)
Creatinine, Ser: 1.5 mg/dL — ABNORMAL HIGH (ref 0.4–1.2)
Creatinine, Ser: 1.51 mg/dL — ABNORMAL HIGH (ref 0.4–1.2)
Creatinine, Ser: 1.62 mg/dL — ABNORMAL HIGH (ref 0.4–1.2)
Creatinine, Ser: 1.67 mg/dL — ABNORMAL HIGH (ref 0.4–1.2)
GFR calc Af Amer: 36 mL/min — ABNORMAL LOW (ref >60)
GFR calc Af Amer: 43 mL/min — ABNORMAL LOW (ref >60)
GFR calc Af Amer: 45 mL/min — ABNORMAL LOW (ref >60)
GFR calc Af Amer: 47 mL/min — ABNORMAL LOW (ref >60)
GFR calc Af Amer: 47 mL/min — ABNORMAL LOW (ref >60)
GFR calc non Af Amer: 36 mL/min — ABNORMAL LOW (ref >60)
GFR calc non Af Amer: 39 mL/min — ABNORMAL LOW (ref >60)
GFR calc non Af Amer: 48 mL/min — ABNORMAL LOW (ref >60)
Glucose, Bld: 118 mg/dL — ABNORMAL HIGH (ref 70–99)
Glucose, Bld: 88 mg/dL (ref 70–99)
Glucose, Bld: 90 mg/dL (ref 70–99)
Potassium: 3.5 mEq/L (ref 3.5–5.1)
Potassium: 3.8 mEq/L (ref 3.5–5.1)
Potassium: 3.8 mEq/L (ref 3.5–5.1)
Potassium: 4 mEq/L (ref 3.5–5.1)
Potassium: 4.5 mEq/L (ref 3.5–5.1)
Potassium: 4.8 mEq/L (ref 3.5–5.1)
Potassium: 4.8 mEq/L (ref 3.5–5.1)
Sodium: 133 mEq/L — ABNORMAL LOW (ref 135–145)
Sodium: 133 mEq/L — ABNORMAL LOW (ref 135–145)
Sodium: 134 mEq/L — ABNORMAL LOW (ref 135–145)
Sodium: 136 mEq/L (ref 135–145)
Sodium: 137 mEq/L (ref 135–145)
Sodium: 137 mEq/L (ref 135–145)
Sodium: 138 mEq/L (ref 135–145)
Sodium: 139 mEq/L (ref 135–145)

## 2011-01-20 LAB — URINALYSIS, ROUTINE W REFLEX MICROSCOPIC
Nitrite: NEGATIVE
Nitrite: NEGATIVE
Protein, ur: >300 mg/dL — AB
Specific Gravity, Urine: 1.027 (ref 1.005–1.030)
Specific Gravity, Urine: 1.031 — ABNORMAL HIGH (ref 1.005–1.030)
Urobilinogen, UA: 1 mg/dL (ref 0.0–1.0)
Urobilinogen, UA: 1 mg/dL (ref 0.0–1.0)

## 2011-01-20 LAB — COMPREHENSIVE METABOLIC PANEL
ALT: 35 U/L (ref 0–35)
Albumin: 2.6 g/dL — ABNORMAL LOW (ref 3.5–5.2)
Alkaline Phosphatase: 48 U/L (ref 39–117)
Chloride: 95 mEq/L — ABNORMAL LOW (ref 96–112)
Potassium: 3.6 mEq/L (ref 3.5–5.1)
Sodium: 133 mEq/L — ABNORMAL LOW (ref 135–145)
Total Bilirubin: 0.5 mg/dL (ref 0.3–1.2)
Total Protein: 5.5 g/dL — ABNORMAL LOW (ref 6.0–8.3)

## 2011-01-20 LAB — CBC
HCT: 26.7 % — ABNORMAL LOW (ref 36.0–46.0)
HCT: 29.3 % — ABNORMAL LOW (ref 36.0–46.0)
HCT: 33.2 % — ABNORMAL LOW (ref 36.0–46.0)
HCT: 34.6 % — ABNORMAL LOW (ref 36.0–46.0)
Hemoglobin: 10 g/dL — ABNORMAL LOW (ref 12.0–15.0)
Hemoglobin: 11 g/dL — ABNORMAL LOW (ref 12.0–15.0)
Hemoglobin: 9.3 g/dL — ABNORMAL LOW (ref 12.0–15.0)
MCHC: 31.9 g/dL (ref 30.0–36.0)
MCHC: 32.4 g/dL (ref 30.0–36.0)
MCHC: 32.5 g/dL (ref 30.0–36.0)
MCHC: 32.6 g/dL (ref 30.0–36.0)
MCV: 84.8 fL (ref 78.0–100.0)
MCV: 85.5 fL (ref 78.0–100.0)
MCV: 87.1 fL (ref 78.0–100.0)
Platelets: 277 10*3/uL (ref 150–400)
Platelets: 281 10*3/uL (ref 150–400)
Platelets: 282 10*3/uL (ref 150–400)
RBC: 3.54 MIL/uL — ABNORMAL LOW (ref 3.87–5.11)
RBC: 3.58 MIL/uL — ABNORMAL LOW (ref 3.87–5.11)
RDW: 16.6 % — ABNORMAL HIGH (ref 11.5–15.5)
RDW: 17 % — ABNORMAL HIGH (ref 11.5–15.5)
RDW: 17.5 % — ABNORMAL HIGH (ref 11.5–15.5)
RDW: 17.5 % — ABNORMAL HIGH (ref 11.5–15.5)
WBC: 12.5 10*3/uL — ABNORMAL HIGH (ref 4.0–10.5)
WBC: 8.3 10*3/uL (ref 4.0–10.5)
WBC: 8.5 10*3/uL (ref 4.0–10.5)

## 2011-01-20 LAB — POCT CARDIAC MARKERS
CKMB, poc: 1.4 ng/mL (ref 1.0–8.0)
Myoglobin, poc: 91.7 ng/mL (ref 12–200)
Troponin i, poc: 0.14 ng/mL — ABNORMAL HIGH (ref 0.00–0.09)

## 2011-01-20 LAB — CARDIAC PANEL(CRET KIN+CKTOT+MB+TROPI)
CK, MB: 1.6 ng/mL (ref 0.3–4.0)
CK, MB: 1.7 ng/mL (ref 0.3–4.0)
CK, MB: 1.8 ng/mL (ref 0.3–4.0)
Relative Index: INVALID (ref 0.0–2.5)
Relative Index: INVALID (ref 0.0–2.5)
Total CK: 66 U/L (ref 7–177)
Total CK: 98 U/L (ref 7–177)
Troponin I: 0.07 ng/mL — ABNORMAL HIGH (ref 0.00–0.06)
Troponin I: 0.09 ng/mL — ABNORMAL HIGH (ref 0.00–0.06)
Troponin I: 0.1 ng/mL — ABNORMAL HIGH (ref 0.00–0.06)

## 2011-01-20 LAB — DIFFERENTIAL
Basophils Absolute: 0 10*3/uL (ref 0.0–0.1)
Basophils Absolute: 0 10*3/uL (ref 0.0–0.1)
Basophils Relative: 0 % (ref 0–1)
Basophils Relative: 0 % (ref 0–1)
Eosinophils Absolute: 0.1 10*3/uL (ref 0.0–0.7)
Eosinophils Relative: 0 % (ref 0–5)
Lymphocytes Relative: 7 % — ABNORMAL LOW (ref 12–46)
Monocytes Absolute: 0.9 10*3/uL (ref 0.1–1.0)
Monocytes Absolute: 1.4 10*3/uL — ABNORMAL HIGH (ref 0.1–1.0)
Monocytes Relative: 10 % (ref 3–12)
Monocytes Relative: 11 % (ref 3–12)
Neutro Abs: 7.4 10*3/uL (ref 1.7–7.7)
Neutrophils Relative %: 79 % — ABNORMAL HIGH (ref 43–77)

## 2011-01-20 LAB — RAPID URINE DRUG SCREEN, HOSP PERFORMED
Amphetamines: NOT DETECTED
Tetrahydrocannabinol: NOT DETECTED

## 2011-01-20 LAB — TROPONIN I
Troponin I: 0.1 ng/mL — ABNORMAL HIGH (ref 0.00–0.06)
Troponin I: 0.15 ng/mL — ABNORMAL HIGH (ref 0.00–0.06)

## 2011-01-20 LAB — PROTIME-INR: INR: 1.5 (ref 0.00–1.49)

## 2011-01-20 LAB — HEPATIC FUNCTION PANEL
AST: 33 U/L (ref 0–37)
Albumin: 3.2 g/dL — ABNORMAL LOW (ref 3.5–5.2)
Bilirubin, Direct: 0.3 mg/dL (ref 0.0–0.3)
Total Bilirubin: 1.3 mg/dL — ABNORMAL HIGH (ref 0.3–1.2)

## 2011-01-20 LAB — MAGNESIUM: Magnesium: 2.1 mg/dL (ref 1.5–2.5)

## 2011-01-20 LAB — CK TOTAL AND CKMB (NOT AT ARMC)
CK, MB: 1.5 ng/mL (ref 0.3–4.0)
CK, MB: 3.1 ng/mL (ref 0.3–4.0)
Relative Index: 2.7 — ABNORMAL HIGH (ref 0.0–2.5)
Relative Index: INVALID (ref 0.0–2.5)
Total CK: 100 U/L (ref 7–177)
Total CK: 113 U/L (ref 7–177)

## 2011-01-20 LAB — IRON AND TIBC: UIBC: 289 ug/dL

## 2011-01-20 LAB — URINE MICROSCOPIC-ADD ON

## 2011-01-20 LAB — TSH: TSH: 3.181 u[IU]/mL (ref 0.350–4.500)

## 2014-06-09 ENCOUNTER — Inpatient Hospital Stay (HOSPITAL_COMMUNITY): Payer: Self-pay

## 2014-06-09 ENCOUNTER — Encounter (HOSPITAL_COMMUNITY): Payer: Self-pay | Admitting: *Deleted

## 2014-06-09 ENCOUNTER — Inpatient Hospital Stay (HOSPITAL_COMMUNITY)
Admission: EM | Admit: 2014-06-09 | Discharge: 2014-06-12 | DRG: 291 | Disposition: A | Discharge status: Home / Self Care | Payer: Self-pay | Attending: Internal Medicine | Admitting: Internal Medicine

## 2014-06-09 ENCOUNTER — Emergency Department (HOSPITAL_COMMUNITY): Payer: Self-pay

## 2014-06-09 DIAGNOSIS — M549 Dorsalgia, unspecified: Secondary | ICD-10-CM | POA: Diagnosis present

## 2014-06-09 DIAGNOSIS — J9601 Acute respiratory failure with hypoxia: Secondary | ICD-10-CM | POA: Diagnosis present

## 2014-06-09 DIAGNOSIS — R0781 Pleurodynia: Secondary | ICD-10-CM | POA: Insufficient documentation

## 2014-06-09 DIAGNOSIS — F17210 Nicotine dependence, cigarettes, uncomplicated: Secondary | ICD-10-CM | POA: Diagnosis present

## 2014-06-09 DIAGNOSIS — R6511 Systemic inflammatory response syndrome (SIRS) of non-infectious origin with acute organ dysfunction: Secondary | ICD-10-CM | POA: Diagnosis present

## 2014-06-09 DIAGNOSIS — I4892 Unspecified atrial flutter: Secondary | ICD-10-CM | POA: Diagnosis present

## 2014-06-09 DIAGNOSIS — R0789 Other chest pain: Secondary | ICD-10-CM | POA: Diagnosis present

## 2014-06-09 DIAGNOSIS — J209 Acute bronchitis, unspecified: Secondary | ICD-10-CM | POA: Diagnosis present

## 2014-06-09 DIAGNOSIS — N179 Acute kidney failure, unspecified: Secondary | ICD-10-CM | POA: Diagnosis not present

## 2014-06-09 DIAGNOSIS — D72829 Elevated white blood cell count, unspecified: Secondary | ICD-10-CM | POA: Diagnosis present

## 2014-06-09 DIAGNOSIS — Z8673 Personal history of transient ischemic attack (TIA), and cerebral infarction without residual deficits: Secondary | ICD-10-CM

## 2014-06-09 DIAGNOSIS — Z79899 Other long term (current) drug therapy: Secondary | ICD-10-CM

## 2014-06-09 DIAGNOSIS — N183 Chronic kidney disease, stage 3 unspecified: Secondary | ICD-10-CM | POA: Diagnosis present

## 2014-06-09 DIAGNOSIS — R7989 Other specified abnormal findings of blood chemistry: Secondary | ICD-10-CM

## 2014-06-09 DIAGNOSIS — I10 Essential (primary) hypertension: Secondary | ICD-10-CM | POA: Diagnosis present

## 2014-06-09 DIAGNOSIS — I5023 Acute on chronic systolic (congestive) heart failure: Secondary | ICD-10-CM

## 2014-06-09 DIAGNOSIS — Z882 Allergy status to sulfonamides status: Secondary | ICD-10-CM

## 2014-06-09 DIAGNOSIS — R000 Tachycardia, unspecified: Secondary | ICD-10-CM | POA: Diagnosis present

## 2014-06-09 DIAGNOSIS — R6510 Systemic inflammatory response syndrome (SIRS) of non-infectious origin without acute organ dysfunction: Secondary | ICD-10-CM | POA: Diagnosis present

## 2014-06-09 DIAGNOSIS — Z823 Family history of stroke: Secondary | ICD-10-CM

## 2014-06-09 DIAGNOSIS — Z8249 Family history of ischemic heart disease and other diseases of the circulatory system: Secondary | ICD-10-CM

## 2014-06-09 DIAGNOSIS — I5043 Acute on chronic combined systolic (congestive) and diastolic (congestive) heart failure: Principal | ICD-10-CM | POA: Diagnosis present

## 2014-06-09 DIAGNOSIS — Z9119 Patient's noncompliance with other medical treatment and regimen: Secondary | ICD-10-CM | POA: Diagnosis present

## 2014-06-09 DIAGNOSIS — R079 Chest pain, unspecified: Secondary | ICD-10-CM | POA: Diagnosis present

## 2014-06-09 DIAGNOSIS — R778 Other specified abnormalities of plasma proteins: Secondary | ICD-10-CM | POA: Diagnosis present

## 2014-06-09 DIAGNOSIS — I441 Atrioventricular block, second degree: Secondary | ICD-10-CM | POA: Diagnosis present

## 2014-06-09 DIAGNOSIS — E119 Type 2 diabetes mellitus without complications: Secondary | ICD-10-CM | POA: Diagnosis present

## 2014-06-09 DIAGNOSIS — I5021 Acute systolic (congestive) heart failure: Secondary | ICD-10-CM | POA: Insufficient documentation

## 2014-06-09 DIAGNOSIS — J470 Bronchiectasis with acute lower respiratory infection: Secondary | ICD-10-CM | POA: Diagnosis present

## 2014-06-09 DIAGNOSIS — J069 Acute upper respiratory infection, unspecified: Secondary | ICD-10-CM | POA: Diagnosis present

## 2014-06-09 DIAGNOSIS — I248 Other forms of acute ischemic heart disease: Secondary | ICD-10-CM | POA: Diagnosis present

## 2014-06-09 DIAGNOSIS — I129 Hypertensive chronic kidney disease with stage 1 through stage 4 chronic kidney disease, or unspecified chronic kidney disease: Secondary | ICD-10-CM | POA: Diagnosis present

## 2014-06-09 DIAGNOSIS — T502X5A Adverse effect of carbonic-anhydrase inhibitors, benzothiadiazides and other diuretics, initial encounter: Secondary | ICD-10-CM | POA: Diagnosis present

## 2014-06-09 DIAGNOSIS — D638 Anemia in other chronic diseases classified elsewhere: Secondary | ICD-10-CM | POA: Diagnosis present

## 2014-06-09 DIAGNOSIS — E785 Hyperlipidemia, unspecified: Secondary | ICD-10-CM | POA: Diagnosis present

## 2014-06-09 DIAGNOSIS — I429 Cardiomyopathy, unspecified: Secondary | ICD-10-CM | POA: Diagnosis present

## 2014-06-09 HISTORY — DX: Heart failure, unspecified: I50.9

## 2014-06-09 HISTORY — DX: Other cardiomyopathies: I42.8

## 2014-06-09 HISTORY — DX: Essential (primary) hypertension: I10

## 2014-06-09 HISTORY — DX: Cerebral infarction, unspecified: I63.9

## 2014-06-09 HISTORY — DX: Tobacco use: Z72.0

## 2014-06-09 LAB — CBC WITH DIFFERENTIAL/PLATELET
Basophils Absolute: 0 10*3/uL (ref 0.0–0.1)
Basophils Relative: 0 % (ref 0–1)
EOS PCT: 2 % (ref 0–5)
Eosinophils Absolute: 0.2 10*3/uL (ref 0.0–0.7)
HCT: 31 % — ABNORMAL LOW (ref 36.0–46.0)
HEMOGLOBIN: 9.5 g/dL — AB (ref 12.0–15.0)
Lymphocytes Relative: 14 % (ref 12–46)
Lymphs Abs: 1.5 10*3/uL (ref 0.7–4.0)
MCH: 25.3 pg — AB (ref 26.0–34.0)
MCHC: 30.6 g/dL (ref 30.0–36.0)
MCV: 82.7 fL (ref 78.0–100.0)
Monocytes Absolute: 1 10*3/uL (ref 0.1–1.0)
Monocytes Relative: 9 % (ref 3–12)
Neutro Abs: 8.7 10*3/uL — ABNORMAL HIGH (ref 1.7–7.7)
Neutrophils Relative %: 75 % (ref 43–77)
Platelets: 268 10*3/uL (ref 150–400)
RBC: 3.75 MIL/uL — ABNORMAL LOW (ref 3.87–5.11)
RDW: 18.5 % — ABNORMAL HIGH (ref 11.5–15.5)
WBC: 11.4 10*3/uL — ABNORMAL HIGH (ref 4.0–10.5)

## 2014-06-09 LAB — COMPREHENSIVE METABOLIC PANEL
ALK PHOS: 56 U/L (ref 39–117)
ALT: 23 U/L (ref 0–35)
ANION GAP: 10 (ref 5–15)
AST: 24 U/L (ref 0–37)
Albumin: 3.8 g/dL (ref 3.5–5.2)
BUN: 18 mg/dL (ref 6–23)
CO2: 25 mmol/L (ref 19–32)
Calcium: 9.1 mg/dL (ref 8.4–10.5)
Chloride: 104 mmol/L (ref 96–112)
Creatinine, Ser: 1.14 mg/dL — ABNORMAL HIGH (ref 0.50–1.10)
GFR, EST AFRICAN AMERICAN: 67 mL/min — AB (ref >90)
GFR, EST NON AFRICAN AMERICAN: 58 mL/min — AB (ref >90)
GLUCOSE: 101 mg/dL — AB (ref 70–99)
Potassium: 3.7 mmol/L (ref 3.5–5.1)
Sodium: 139 mmol/L (ref 135–145)
TOTAL PROTEIN: 7 g/dL (ref 6.0–8.3)
Total Bilirubin: 0.4 mg/dL (ref 0.3–1.2)

## 2014-06-09 LAB — RAPID URINE DRUG SCREEN, HOSP PERFORMED
AMPHETAMINES: NOT DETECTED
BARBITURATES: NOT DETECTED
Benzodiazepines: NOT DETECTED
Cocaine: NOT DETECTED
OPIATES: NOT DETECTED
TETRAHYDROCANNABINOL: NOT DETECTED

## 2014-06-09 LAB — URINALYSIS, ROUTINE W REFLEX MICROSCOPIC
Bilirubin Urine: NEGATIVE
Glucose, UA: NEGATIVE mg/dL
HGB URINE DIPSTICK: NEGATIVE
Ketones, ur: NEGATIVE mg/dL
Leukocytes, UA: NEGATIVE
Nitrite: NEGATIVE
PROTEIN: NEGATIVE mg/dL
Specific Gravity, Urine: 1.014 (ref 1.005–1.030)
UROBILINOGEN UA: 0.2 mg/dL (ref 0.0–1.0)
pH: 5.5 (ref 5.0–8.0)

## 2014-06-09 LAB — TROPONIN I
TROPONIN I: 0.27 ng/mL — AB (ref <0.031)
TROPONIN I: 0.25 ng/mL — AB (ref <0.031)

## 2014-06-09 LAB — TSH: TSH: 3.019 u[IU]/mL (ref 0.350–4.500)

## 2014-06-09 LAB — I-STAT TROPONIN, ED: Troponin i, poc: 0.1 ng/mL (ref 0.00–0.08)

## 2014-06-09 LAB — PROTIME-INR
INR: 1.13 (ref 0.00–1.49)
Prothrombin Time: 14.6 seconds (ref 11.6–15.2)

## 2014-06-09 LAB — PREGNANCY, URINE: Preg Test, Ur: NEGATIVE

## 2014-06-09 LAB — ETHANOL

## 2014-06-09 LAB — APTT: aPTT: 33 seconds (ref 24–37)

## 2014-06-09 LAB — BRAIN NATRIURETIC PEPTIDE
B Natriuretic Peptide: 247.6 pg/mL — ABNORMAL HIGH (ref 0.0–100.0)
B Natriuretic Peptide: 272.9 pg/mL — ABNORMAL HIGH (ref 0.0–100.0)

## 2014-06-09 MED ORDER — TRAZODONE HCL 50 MG PO TABS
50.0000 mg | ORAL_TABLET | Freq: Every day | ORAL | Status: DC
  Administered 2014-06-09 – 2014-06-12 (×4): 50 mg via ORAL
  Filled 2014-06-09 (×3): qty 2

## 2014-06-09 MED ORDER — ACETAMINOPHEN 325 MG PO TABS
650.0000 mg | ORAL_TABLET | Freq: Four times a day (QID) | ORAL | Status: DC | PRN
  Administered 2014-06-10: 650 mg via ORAL
  Filled 2014-06-09: qty 2

## 2014-06-09 MED ORDER — HEPARIN (PORCINE) IN NACL 100-0.45 UNIT/ML-% IJ SOLN
1100.0000 [IU]/h | INTRAMUSCULAR | Status: DC
  Administered 2014-06-09: 1100 [IU]/h via INTRAVENOUS
  Filled 2014-06-09: qty 250

## 2014-06-09 MED ORDER — FUROSEMIDE 10 MG/ML IJ SOLN
40.0000 mg | Freq: Two times a day (BID) | INTRAMUSCULAR | Status: DC
  Administered 2014-06-09 – 2014-06-10 (×2): 40 mg via INTRAVENOUS
  Filled 2014-06-09 (×2): qty 4

## 2014-06-09 MED ORDER — HYDROMORPHONE HCL 1 MG/ML IJ SOLN
1.0000 mg | Freq: Once | INTRAMUSCULAR | Status: AC
  Administered 2014-06-09: 1 mg via INTRAVENOUS
  Filled 2014-06-09: qty 1

## 2014-06-09 MED ORDER — ALBUTEROL SULFATE (2.5 MG/3ML) 0.083% IN NEBU
5.0000 mg | INHALATION_SOLUTION | Freq: Once | RESPIRATORY_TRACT | Status: AC
  Administered 2014-06-09: 5 mg via RESPIRATORY_TRACT
  Filled 2014-06-09 (×2): qty 6

## 2014-06-09 MED ORDER — ONDANSETRON HCL 4 MG/2ML IJ SOLN
4.0000 mg | Freq: Four times a day (QID) | INTRAMUSCULAR | Status: DC | PRN
  Administered 2014-06-09: 4 mg via INTRAVENOUS
  Filled 2014-06-09: qty 2

## 2014-06-09 MED ORDER — HEPARIN BOLUS VIA INFUSION
4000.0000 [IU] | Freq: Once | INTRAVENOUS | Status: AC
  Administered 2014-06-09: 4000 [IU] via INTRAVENOUS
  Filled 2014-06-09: qty 4000

## 2014-06-09 MED ORDER — MORPHINE SULFATE 2 MG/ML IJ SOLN
1.0000 mg | INTRAMUSCULAR | Status: DC | PRN
  Administered 2014-06-09 – 2014-06-10 (×4): 1 mg via INTRAVENOUS
  Filled 2014-06-09 (×4): qty 1

## 2014-06-09 MED ORDER — CARVEDILOL 6.25 MG PO TABS
6.2500 mg | ORAL_TABLET | Freq: Two times a day (BID) | ORAL | Status: DC
  Administered 2014-06-09 – 2014-06-11 (×4): 6.25 mg via ORAL
  Filled 2014-06-09 (×4): qty 1

## 2014-06-09 MED ORDER — LORAZEPAM 1 MG PO TABS
1.0000 mg | ORAL_TABLET | Freq: Four times a day (QID) | ORAL | Status: DC | PRN
  Administered 2014-06-11: 1 mg via ORAL
  Filled 2014-06-09 (×2): qty 1

## 2014-06-09 MED ORDER — SPIRONOLACTONE 25 MG PO TABS
25.0000 mg | ORAL_TABLET | Freq: Every day | ORAL | Status: DC
  Administered 2014-06-09 – 2014-06-11 (×3): 25 mg via ORAL
  Filled 2014-06-09 (×4): qty 1

## 2014-06-09 MED ORDER — GUAIFENESIN 100 MG/5ML PO SOLN
200.0000 mg | ORAL | Status: DC | PRN
  Administered 2014-06-09 – 2014-06-12 (×7): 200 mg via ORAL
  Filled 2014-06-09 (×8): qty 10

## 2014-06-09 MED ORDER — ENALAPRIL MALEATE 20 MG PO TABS
20.0000 mg | ORAL_TABLET | Freq: Two times a day (BID) | ORAL | Status: DC
  Administered 2014-06-09 – 2014-06-11 (×3): 20 mg via ORAL
  Filled 2014-06-09 (×5): qty 1

## 2014-06-09 MED ORDER — SODIUM CHLORIDE 0.9 % IV SOLN
INTRAVENOUS | Status: DC
  Administered 2014-06-09 – 2014-06-10 (×2): via INTRAVENOUS

## 2014-06-09 MED ORDER — HYDRALAZINE HCL 50 MG PO TABS
50.0000 mg | ORAL_TABLET | Freq: Three times a day (TID) | ORAL | Status: DC
  Administered 2014-06-09 – 2014-06-12 (×8): 50 mg via ORAL
  Filled 2014-06-09 (×10): qty 1

## 2014-06-09 MED ORDER — LEVALBUTEROL HCL 1.25 MG/0.5ML IN NEBU
1.2500 mg | INHALATION_SOLUTION | Freq: Four times a day (QID) | RESPIRATORY_TRACT | Status: DC | PRN
  Administered 2014-06-09 – 2014-06-12 (×3): 1.25 mg via RESPIRATORY_TRACT
  Filled 2014-06-09 (×4): qty 0.5

## 2014-06-09 MED ORDER — ENOXAPARIN SODIUM 60 MG/0.6ML ~~LOC~~ SOLN
50.0000 mg | SUBCUTANEOUS | Status: DC
  Filled 2014-06-09: qty 0.6

## 2014-06-09 MED ORDER — PRAVASTATIN SODIUM 40 MG PO TABS
40.0000 mg | ORAL_TABLET | Freq: Every day | ORAL | Status: DC
  Administered 2014-06-09 – 2014-06-11 (×3): 40 mg via ORAL
  Filled 2014-06-09 (×4): qty 1

## 2014-06-09 MED ORDER — CARVEDILOL 25 MG PO TABS: 25.0000 mg | ORAL_TABLET | Freq: Two times a day (BID) | ORAL | Status: DC

## 2014-06-09 MED ORDER — ONDANSETRON HCL 4 MG PO TABS: 4.0000 mg | ORAL_TABLET | Freq: Four times a day (QID) | ORAL | Status: DC | PRN

## 2014-06-09 MED ORDER — FUROSEMIDE 40 MG PO TABS: 40.0000 mg | ORAL_TABLET | Freq: Two times a day (BID) | ORAL | Status: DC

## 2014-06-09 MED ORDER — SODIUM CHLORIDE 0.9 % IJ SOLN
3.0000 mL | Freq: Two times a day (BID) | INTRAMUSCULAR | Status: DC
  Administered 2014-06-09 – 2014-06-12 (×6): 3 mL via INTRAVENOUS

## 2014-06-09 MED ORDER — FLUTICASONE PROPIONATE 50 MCG/ACT NA SUSP
2.0000 | Freq: Two times a day (BID) | NASAL | Status: DC | PRN
  Filled 2014-06-09: qty 16

## 2014-06-09 MED ORDER — ACETAMINOPHEN 650 MG RE SUPP: 650.0000 mg | Freq: Four times a day (QID) | RECTAL | Status: DC | PRN

## 2014-06-09 MED ORDER — ALBUTEROL SULFATE (2.5 MG/3ML) 0.083% IN NEBU
INHALATION_SOLUTION | RESPIRATORY_TRACT | Status: AC
  Administered 2014-06-09: 13:00:00
  Filled 2014-06-09: qty 3

## 2014-06-09 MED ORDER — DEXTROSE 5 % IV SOLN
500.0000 mg | INTRAVENOUS | Status: DC
  Administered 2014-06-09 – 2014-06-11 (×3): 500 mg via INTRAVENOUS
  Filled 2014-06-09 (×4): qty 500

## 2014-06-09 MED ORDER — INFLUENZA VAC SPLIT QUAD 0.5 ML IM SUSY
0.5000 mL | PREFILLED_SYRINGE | INTRAMUSCULAR | Status: AC
  Administered 2014-06-10: 0.5 mL via INTRAMUSCULAR
  Filled 2014-06-09 (×2): qty 0.5

## 2014-06-09 NOTE — ED Provider Notes (Signed)
CSN: [id number]     Arrival date & time 06/09/14  1037 History   First MD Initiated Contact with Patient 06/09/14 1208     Chief Complaint  Patient presents with  . URI  . Back Pain     (Consider location/radiation/quality/duration/timing/severity/associated sxs/prior Treatment) Patient is a 44 y.o. female presenting with URI and back pain.  URI Presenting symptoms: congestion, cough and fever   Severity:  Moderate Onset quality:  Gradual Duration:  2 days Timing:  Constant Progression:  Worsening Chronicity:  New Relieved by:  Nothing Worsened by:  Breathing Ineffective treatments:  None tried Associated symptoms: no headaches and no neck pain   Back Pain Associated symptoms: fever   Associated symptoms: no headaches     Past Medical History  Diagnosis Date  . Congestive heart failure   . Diabetes mellitus without complication   . Stroke     2010   Past Surgical History  Procedure Laterality Date  . Cardiac catheterization      2012  . Carotid stent insertion      2010   History reviewed. No pertinent family history. History  Substance Use Topics  . Smoking status: Current Every Day Smoker -- 0.30 packs/day for 12 years    Types: Cigarettes  . Smokeless tobacco: Not on file  . Alcohol Use: Yes     Comment: rarely   OB History    No data available     Review of Systems  Constitutional: Positive for fever.  HENT: Positive for congestion.   Respiratory: Positive for cough.   Musculoskeletal: Positive for back pain. Negative for neck pain.  Neurological: Negative for headaches.  All other systems reviewed and are negative.     Allergies  Sulfonamide derivatives  Home Medications   Prior to Admission medications   Medication Sig Start Date End Date Taking? Authorizing Provider  carvedilol (COREG) 25 MG tablet Take 25 mg by mouth 2 (two) times daily with a meal.   Yes Historical Provider, MD  DM-Phenylephrine-Acetaminophen 10-5-325 MG TABS Take 2  tablets by mouth every 4 (four) hours as needed (for cold).   Yes Historical Provider, MD  enalapril (VASOTEC) 20 MG tablet Take 20 mg by mouth 2 (two) times daily.   Yes Historical Provider, MD  fluticasone (FLONASE) 50 MCG/ACT nasal spray Place 2 sprays into both nostrils 2 (two) times daily as needed for allergies or rhinitis.   Yes Historical Provider, MD  furosemide (LASIX) 40 MG tablet Take 40 mg by mouth 2 (two) times daily.   Yes Historical Provider, MD  hydrALAZINE (APRESOLINE) 50 MG tablet Take 50 mg by mouth 3 (three) times daily.   Yes Historical Provider, MD  LORazepam (ATIVAN) 1 MG tablet Take 1 mg by mouth every 6 (six) hours as needed for anxiety.   Yes Historical Provider, MD  pravastatin (PRAVACHOL) 40 MG tablet Take 40 mg by mouth daily.   Yes Historical Provider, MD  spironolactone (ALDACTONE) 25 MG tablet Take 25 mg by mouth daily.   Yes Historical Provider, MD  traZODone (DESYREL) 50 MG tablet Take 50-100 mg by mouth at bedtime.   Yes Historical Provider, MD   BP 168/98 mmHg  Pulse 124  Temp(Src) 98.9 F (37.2 C) (Oral)  Resp 20  SpO2 95%  LMP 05/19/2014 Physical Exam  Constitutional: She is oriented to person, place, and time. She appears well-developed and well-nourished.  HENT:  Head: Normocephalic and atraumatic.  Right Ear: External ear normal.  Left Ear: External  ear normal.  Eyes: Conjunctivae and EOM are normal. Pupils are equal, round, and reactive to light.  Neck: Normal range of motion. Neck supple.  Cardiovascular: Regular rhythm, normal heart sounds and intact distal pulses.  Tachycardia present.   Pulmonary/Chest: Effort normal and breath sounds normal.  Abdominal: Soft. Bowel sounds are normal. There is no tenderness.  Musculoskeletal: Normal range of motion.  Neurological: She is alert and oriented to person, place, and time.  Skin: Skin is warm and dry.  Vitals reviewed.   ED Course  Procedures (including critical care time) Labs Review Labs  Reviewed  CBC WITH DIFFERENTIAL/PLATELET - Abnormal; Notable for the following:    WBC 11.4 (*)    RBC 3.75 (*)    Hemoglobin 9.5 (*)    HCT 31.0 (*)    MCH 25.3 (*)    RDW 18.5 (*)    Neutro Abs 8.7 (*)    All other components within normal limits  BRAIN NATRIURETIC PEPTIDE - Abnormal; Notable for the following:    B Natriuretic Peptide 247.6 (*)    All other components within normal limits  COMPREHENSIVE METABOLIC PANEL - Abnormal; Notable for the following:    Glucose, Bld 101 (*)    Creatinine, Ser 1.14 (*)    GFR calc non Af Amer 58 (*)    GFR calc Af Amer 67 (*)    All other components within normal limits  I-STAT TROPOININ, ED - Abnormal; Notable for the following:    Troponin i, poc 0.10 (*)    All other components within normal limits  URINALYSIS, ROUTINE W REFLEX MICROSCOPIC  URINE RAPID DRUG SCREEN (HOSP PERFORMED)  ETHANOL  POC URINE PREG, ED    Imaging Review Dg Chest 2 View  06/09/2014   CLINICAL DATA:  Upper respiratory tract infection with back pain  EXAM: CHEST  2 VIEW  COMPARISON:  11/04/2008  FINDINGS: Cardiac shadow remains enlarged but stable. Mild vascular congestion is seen with interstitial edema consistent with mild CHF. No focal infiltrate or sizable effusion is noted.  IMPRESSION: Mild CHF.   Electronically Signed   By: Inez Catalina M.D.   On: 06/09/2014 13:09     EKG Interpretation   Date/Time:  Tuesday June 09 2014 11:21:37 EST Ventricular Rate:  122 PR Interval:    QRS Duration: 119 QT Interval:  389 QTC Calculation: 554 R Axis:   116 Text Interpretation:  sinus tachycardia Consider left ventricular  hypertrophy ST elevation, consider anterolateral injury Prolonged QT  interval Confirmed by Debby Freiberg [phone]) on 06/09/2014 12:47:00 PM      MDM   Final diagnoses:  Chest pain, unspecified chest pain type    44 y.o. female with pertinent PMH of CHF, nonischemic, DM presents with cough, back pain.  Pt complains of pleurisy and  dyspnea in context of URI symptoms.  Physical exam as above.  Wu with new t wave inversions in lateral leads, positive trop.  Consulted hospitalist for admission.  Also spoke with cardiology for consult.  I have reviewed all laboratory and imaging studies if ordered as above  1. Chest pain, unspecified chest pain type         Debby Freiberg, MD 06/09/14 [phone]

## 2014-06-09 NOTE — Progress Notes (Addendum)
Troponin continues to rise. Pt continues to c/o Chest pain. Review of cardio consult note states would use IV Heparin until ACS can be ruled out. Also, CTA chest ordered to r/o PE in light of CP, SOB and hypoxia on admission.  Lovenox for VTE ppx d/c'd and Heparin IV started and will be managed by pharmacy. Pt is already on a BB and statin. Cardio may want to change statin from Pravachol. R/p EKG now. Clance Boll, NP [facility] Update: Next troponin trending down at .25.  KJKG, NP

## 2014-06-09 NOTE — ED Notes (Signed)
Bed: WA19 Expected date:  Expected time:  Means of arrival:  Comments: 

## 2014-06-09 NOTE — ED Notes (Signed)
Pt reports URI, now has bil lower back pain. Pain 10/10. Hx stroke, CHF, and heart failure. Reports slight SOB. Able to speak in full sentences.

## 2014-06-09 NOTE — Progress Notes (Signed)
ANTICOAGULATION CONSULT NOTE - Initial Consult  Pharmacy Consult for Heparin Indication: R/O ACS/STEMI; r/o PE  Allergies  Allergen Reactions  . Sulfonamide Derivatives Hives    Patient Measurements: Height: 5\' 4"  (162.6 cm) Weight: 231 lb 11.3 oz (105.1 kg) IBW/kg (Calculated) : 54.7 BMI = 39  Vital Signs: Temp: 98.5 F (36.9 C) (02/23 2012) Temp Source: Oral (02/23 2012) BP: 138/89 mmHg (02/23 2012) Pulse Rate: 118 (02/23 2012)  Labs:  Recent Labs  06/09/14 1329 06/09/14 1708  HGB 9.5*  --   HCT 31.0*  --   PLT 268  --   CREATININE 1.14*  --   TROPONINI  --  0.27*    Estimated Creatinine Clearance: 74.5 mL/min (by C-G formula based on Cr of 1.14).   Medical History: Past Medical History  Diagnosis Date  . Chronic systolic CHF   . Stroke     2010  . HTN (hypertension)   . Tobacco abuse   . Non-ischemic cardiomyopathy     Medications:  Scheduled:  . azithromycin  500 mg Intravenous Q24H  . carvedilol  6.25 mg Oral BID WC  . enalapril  20 mg Oral BID  . furosemide  40 mg Intravenous Q12H  . heparin  4,000 Units Intravenous Once  . hydrALAZINE  50 mg Oral TID  . [START ON 06/10/2014] Influenza vac split quadrivalent PF  0.5 mL Intramuscular Tomorrow-1000  . pravastatin  40 mg Oral Daily  . sodium chloride  3 mL Intravenous Q12H  . spironolactone  25 mg Oral Daily  . traZODone  50-100 mg Oral QHS   Infusions:  . sodium chloride 10 mL/hr at 06/09/14 2018  . heparin      Assessment:  44 yr female with h/o CHF, DM.  Being admission for chest pain.  Initially, pharmacy consulted to dose Lovenox for VTE prophylaxis; however Troponin continues to rise and patient with c/o chest pain.  Cardiology consult recommended IV heparin.  Lovenox order was d/c'ed prior to med administration  CTAngio ordered to r/o PE  Goal of Therapy:  Heparin Level = 0.3-0.7 Monior H/H and Platelet count per protocol   Plan:   Obtain baseline PTT and INR  Begin IV  heparin 4000 unit bolus x 1 then begin infusion @ 1100 units/hr  Check heparin level 6 hr after heparin started  Follow heparin level and CBC daily  Lin Glazier, Toribio Harbour, PharmD 06/09/2014,9:29 PM

## 2014-06-09 NOTE — Consult Note (Signed)
Patient ID: [name] MRN: [id number] DOB/AGE: 1971/03/29 44 y.o.  Admit date: 06/09/2014 Referring Physician:  Primary Physician:  Primary Cardiologist:  Reason for Consultation:   HPI: 44 yo female with history of cardiomyopathy (presumed to be non-ischemic), chronic systolic CHF, HTN, DM, CVA 2010, tobacco abuse being admitted with dyspnea, cough, chest congestion and fevers by the Hospitalist service. Troponin POC is slightly elevated. EKG with sinus tachycardia, LVH and diffuse T wave abnormalities.  She endorses back pain but no chest pain. Chest CTA is being performed today to exclude aortic pathology/PE. She has been seen in the past by Dr. Candee Furbish and by Dr. Marijo File. She has not been seen by cardiology since 2010 here but it sounds like she has seen someone in [place], [place] at least once over last 4 years. She has been known to have LV systolic dysfunction for at least the last 6 years. Last echo in our system was in November 2009. LVEF at that time was 30% with akinesis of the anteroseptal wall with severe LVH. She has had uncontrolled HTN over the years with times of medical non-compliance. There is also mention in the record of possible post-partum cardiomyopathy. No cardiac cath is found in the records and she denies having had this procedure.   She tells me that she has had cough with clear sputum, sinus congestion, pleuritic chest pressure, upper back pain for 10 days. Today she had a fever at home. She reports compliance with medications including Lasix 40 mg po BID. Mild LE edema.   Past Medical History  Diagnosis Date  . Chronic systolic CHF   . Stroke     2010  . HTN (hypertension)   . Tobacco abuse   . Non-ischemic cardiomyopathy     Family History  Problem Relation Age of Onset  . CVA Mother     History   Social History  . Marital Status: Single    Spouse Name: N/A  . Number of Children: N/A  . Years of Education: N/A   Occupational  History  . Not on file.   Social History Main Topics  . Smoking status: Current Every Day Smoker -- 0.30 packs/day for 12 years    Types: Cigarettes  . Smokeless tobacco: Not on file  . Alcohol Use: Yes     Comment: rarely  . Drug Use: Not on file  . Sexual Activity: Not on file   Other Topics Concern  . Not on file   Social History Narrative  . No narrative on file    Past Surgical History  Procedure Laterality Date  . Cardiac catheterization      2012  . Carotid stent insertion      2010  . Cesarean section      Allergies  Allergen Reactions  . Sulfonamide Derivatives Hives    Prior to Admission medications   Medication Sig Start Date End Date Taking? Authorizing Provider  carvedilol (COREG) 25 MG tablet Take 25 mg by mouth 2 (two) times daily with a meal.   Yes Historical Provider, MD  DM-Phenylephrine-Acetaminophen 10-5-325 MG TABS Take 2 tablets by mouth every 4 (four) hours as needed (for cold).   Yes Historical Provider, MD  enalapril (VASOTEC) 20 MG tablet Take 20 mg by mouth 2 (two) times daily.   Yes Historical Provider, MD  fluticasone (FLONASE) 50 MCG/ACT nasal spray Place 2 sprays into both nostrils 2 (two) times daily as needed for allergies or rhinitis.  Yes Historical Provider, MD  furosemide (LASIX) 40 MG tablet Take 40 mg by mouth 2 (two) times daily.   Yes Historical Provider, MD  hydrALAZINE (APRESOLINE) 50 MG tablet Take 50 mg by mouth 3 (three) times daily.   Yes Historical Provider, MD  LORazepam (ATIVAN) 1 MG tablet Take 1 mg by mouth every 6 (six) hours as needed for anxiety.   Yes Historical Provider, MD  pravastatin (PRAVACHOL) 40 MG tablet Take 40 mg by mouth daily.   Yes Historical Provider, MD  spironolactone (ALDACTONE) 25 MG tablet Take 25 mg by mouth daily.   Yes Historical Provider, MD  traZODone (DESYREL) 50 MG tablet Take 50-100 mg by mouth at bedtime.   Yes Historical Provider, MD   Hospital Medications:  . carvedilol  6.25 mg Oral  BID WC  . enalapril  20 mg Oral BID  . enoxaparin (LOVENOX) injection  50 mg Subcutaneous Q24H  . furosemide  40 mg Oral BID  . hydrALAZINE  50 mg Oral TID  . [START ON 06/10/2014] Influenza vac split quadrivalent PF  0.5 mL Intramuscular Tomorrow-1000  . pravastatin  40 mg Oral Daily  . sodium chloride  3 mL Intravenous Q12H  . spironolactone  25 mg Oral Daily  . traZODone  50-100 mg Oral QHS    Review of systems complete and found to be negative unless listed above    Physical Exam: Blood pressure 156/89, pulse 124, temperature 98.9 F (37.2 C), temperature source Oral, resp. rate 20, height 5\' 4"  (1.626 m), weight 231 lb 11.3 oz (105.1 kg), last menstrual period 05/19/2014, SpO2 98 %.    General: Well developed, well nourished, NAD  HEENT: OP clear, mucus membranes moist  SKIN: warm, dry. No rashes.  Neuro: No focal deficits  Musculoskeletal: Muscle strength 5/5 all ext  Psychiatric: Mood and affect normal  Neck: + JVD, no carotid bruits, no thyromegaly, no lymphadenopathy.  Lungs: Mild bibasilar crackles. No wheezes, rhonci, crackles  Cardiovascular: Regular rate and rhythm. No murmurs, gallops or rubs.  Abdomen:Soft. Bowel sounds present. Non-tender.  Extremities: No lower extremity edema. Pulses are 2 + in the bilateral DP/PT.  Labs:   Lab Results  Component Value Date   WBC 11.4* 06/09/2014   HGB 9.5* 06/09/2014   HCT 31.0* 06/09/2014   MCV 82.7 06/09/2014   PLT 268 06/09/2014     Recent Labs Lab 06/09/14 1329  NA 139  K 3.7  CL 104  CO2 25  BUN 18  CREATININE 1.14*  CALCIUM 9.1  PROT 7.0  BILITOT 0.4  ALKPHOS 56  ALT 23  AST 24  GLUCOSE 101*   Troponin (Point of Care Test)  Recent Labs  06/09/14 1445  TROPIPOC 0.10*    Echo November 2009:  Overall left ventricular systolic function was moderately to    markedly decreased. Left ventricular ejection fraction was    estimated to be 30 %. There was moderate diffuse left     ventricular hypokinesis. There was akinesis of the entire    anteroseptal wall. Left ventricular wall thickness was    moderately to markedly increased. Doppler parameters were    consistent with abnormal left ventricular relaxation. - Aortic valve thickness was mildly increased. There was mild    aortic valvular regurgitation. - There was mild mitral valvular regurgitation. - The left atrium was moderately dilated. - The inferior vena cava was moderately dilated. Respirophasic    changes in inferior vena cava dimension were absent.   Chest x-ray:  FINDINGS: Cardiac shadow remains enlarged but stable. Mild vascular congestion is seen with interstitial edema consistent with mild CHF. No focal infiltrate or sizable effusion is noted.  IMPRESSION: Mild CHF.  EKG: sinus tachycardia, LVH and diffuse T wave abnormalities.   ASSESSMENT AND PLAN:   1. Acute on chronic systolic CHF: She is being admitted to telemetry. She appears to be mildly volume overloaded. Chest xray with mild pulmonary edema c/w CHF. BNP mildly elevated. Would diurese with IV Lasix tonight.   2. Cardiomyopathy: Last LVEF=30% in 2009. This has been felt to be a non-ischemic cardiomyopathy in the past. She reports [facility] cath at [facility] in 2012. Repeat echo to assess LVEF.   3. Elevated troponin: May be related to acute CHF exacerbation in patient with cardiomyopathy. Workup to exclude PE is underway with chest CTA. EKG is difficult to interpret given LVH changes. She may ultimately need a cardiac cath to exclude CAD but this will be discussed further as more information is available. Continue to cycle cardiac markers. Would treat with IV heparin until it is clear if this is ACS.   4. Possible acute bronchitis: Per primary team   Signed: Lauree Chandler, MD 06/09/2014, 5:53 PM

## 2014-06-09 NOTE — Progress Notes (Signed)
ANTICOAGULATION CONSULT NOTE - Initial Consult  Pharmacy Consult for Lovenox Indication: VTE prophylaxis  Allergies  Allergen Reactions  . Sulfonamide Derivatives Hives    Patient Measurements: Height: 5\' 4"  (162.6 cm) Weight: 231 lb 11.3 oz (105.1 kg) IBW/kg (Calculated) : 54.7 BMI = 39  Vital Signs: Temp: 98.9 F (37.2 C) (02/23 1700) Temp Source: Oral (02/23 1700) BP: 156/89 mmHg (02/23 1700) Pulse Rate: 124 (02/23 1400)  Labs:  Recent Labs  06/09/14 1329  HGB 9.5*  HCT 31.0*  PLT 268  CREATININE 1.14*    Estimated Creatinine Clearance: 74.5 mL/min (by C-G formula based on Cr of 1.14).   Medical History: Past Medical History  Diagnosis Date  . Congestive heart failure   . Diabetes mellitus without complication   . Stroke     2010    Medications:  Scheduled:  . carvedilol  6.25 mg Oral BID WC  . enalapril  20 mg Oral BID  . enoxaparin (LOVENOX) injection  50 mg Subcutaneous Q24H  . furosemide  40 mg Oral BID  . hydrALAZINE  50 mg Oral TID  . [START ON 06/10/2014] Influenza vac split quadrivalent PF  0.5 mL Intramuscular Tomorrow-1000  . pravastatin  40 mg Oral Daily  . sodium chloride  3 mL Intravenous Q12H  . spironolactone  25 mg Oral Daily  . traZODone  50-100 mg Oral QHS   Infusions:  . sodium chloride      Assessment:  44 yr female with h/o CHF, DM.  Being admission for chest pain.  Pharmacy asked to dose Lovenox for VTE prophylaxis  Patient's BMI > 30 (BMI = 39) therefore, dosage adjustment required  Goal of Therapy:  Prophylactic anticoagulation   Plan:  Lovenox 50mg  sq q24h (0.5 mg/kg/q24h)  Johnathon Olden, Toribio Harbour, PharmD 06/09/2014,5:47 PM

## 2014-06-09 NOTE — ED Notes (Signed)
Pt made aware urine sample needed 

## 2014-06-09 NOTE — H&P (Signed)
[facility] History and Physical  [name] TXH:[id number] DOB: 1970/07/30 DOA: 06/09/2014  Referring physician: ER physician PCP: Pcp Not In System   Chief Complaint: fever, cough, shortness of breath   HPI:  44 year old female with history of chronic systolic CHF (2 D ECHO in 2010 shoed EF of 25%), hypertension, dyslipidemia, CVA in 2010 who presented to [facility] ED with worsening congestion, cough productive of clear to yellow sputum, fever for past 10 days prior to this admission and fever just started 24 hours prior to the admission. Patient reported having right sided rib cage pain and chest pain with coughing. She also reported more shortness of breath with exertion as well as at rest. No palpitations. No abdominal pain, nausea or vomiting. No Blood in stool or urine. No urinary complaints. No lightheadedness of loss of consciousness.   In ED, BP was 179/117 but has improved to 156/89 subsequently, HR was 119-124, RR 24, oxygen saturation 92% on [place] oxygen support. T max was 98.9 F. Blood work showed WBC count of 11.4, hemoglobin 9.5, creatine 1.14, troponin 0.1, BNP 247. The 12 lead EKG showed sinus tachycardia. CXR showed mild CHF but no acute cardiopulmonary process. TRH asked for admission for further evaluation of tachycardia, hypoxia and shortness of breath.   Assessment & Plan    Principal Problem:   Acute respiratory failure with hypoxia / Acute on chronic combined systolic and diastolic heart failure - Hypoxia likely due to acute CHF decompensation; obtain CT angio chest to rule out pulmonary embolism  - BNP on this admisison 247. CXR shows mild CHF. - 2 D ECHO in 2010 with EF of 25%; repeat 2 D ECHO on this admission - appreciate cardio consult and recommendations - for now, resume lasix 40 mg PO BID, coreg PO BID but reduced dose to 6.25 mg instead of 25 mg, continue spironolactone   Active Problems:   Troponin level elevated / Tachycardia - sinus tachycardia seen on  12 lead EKG. In addition, pt noted to be hypoxia with 92% O2 sat while on 4 L  oxygen support - work up in ED ongoing for CT angio chest to rule out pulmonary embolism  - mild troponin elevation likely due to demand ischemia from hypoxia and / or CKD - pt on coreg but we reduced the dose to 6.25 mg BID instead of 25 mg PO BID due to acute CHF decompensation - cardiology consulted - repeat 12 lead EKG in am    Fever, productive cough / SIRS - SIRS criteria met on admission with tachycardia, tachypnea, hypoxia, leukocytosis.  - although no fevers on admission, pt did have fever at home in past 24 hours as well as cough productive of yellow sputum - will check respiratory viral panel - start empiric azithromycin     Hypertension - resumed lasix, coreg at lower dose, hydralazine, spironolactone, lisinopril      CKD (chronic kidney disease) stage 3, GFR 30-59 ml/min - Creatinine in 2010 was 1.3 and on this admission 1.14 - continue to monitor renal function    Leukocytosis - unclear etiology, possible bronchitis or URI - obtain UA - no evidence of pneumonia on CXR    Anemia of chronic disease - Likely due to history of CKD - monitor CBC - no indications for transfusion     Dyslipidemia - continue Pravachol     DVT prophylaxis:  - Lovenox subQ ordered while pt is in hospital   Radiological Exams on Admission: Dg Chest 2  View 06/09/2014  Mild CHF.      EKG: sinus tachycardia  Code Status: Full Family Communication: Plan of care discussed with the patient  Disposition Plan: Admit for further evaluation, telemetry unit   Leisa Lenz, MD  [facility] Pager [phone]  Review of Systems:  Constitutional: Negative for fever, chills and malaise/fatigue. Negative for diaphoresis.  HENT: Negative for hearing loss, ear pain, nosebleeds, congestion, sore throat, neck pain, tinnitus and ear discharge.   Eyes: Negative for blurred vision, double vision, photophobia, pain,  discharge and redness.  Respiratory: per HPI   Cardiovascular: Negative for chest pain, palpitations, orthopnea, claudication and leg swelling.  Gastrointestinal: Negative for nausea, vomiting and abdominal pain. Negative for heartburn, constipation, blood in stool and melena.  Genitourinary: Negative for dysuria, urgency, frequency, hematuria and flank pain.  Musculoskeletal: Negative for myalgias, back pain, joint pain and falls.  Skin: Negative for itching and rash.  Neurological: Negative for dizziness and weakness. Negative for tingling, tremors, sensory change, speech change, focal weakness, loss of consciousness and headaches.  Endo/Heme/Allergies: Negative for environmental allergies and polydipsia. Does not bruise/bleed easily.  Psychiatric/Behavioral: Negative for suicidal ideas. The patient is not nervous/anxious.      Past Medical History  Diagnosis Date  . Congestive heart failure   . Diabetes mellitus without complication   . Stroke     2010   Past Surgical History  Procedure Laterality Date  . Cardiac catheterization      2012  . Carotid stent insertion      2010   Social History:  reports that she has been smoking Cigarettes.  She has a 3.6 pack-year smoking history. She does not have any smokeless tobacco history on file. She reports that she drinks alcohol. Her drug history is not on file.  Allergies  Allergen Reactions  . Sulfonamide Derivatives Hives    Family History: HTN mother, father   Prior to Admission medications   Medication Sig Start Date End Date Taking? Authorizing Provider  carvedilol (COREG) 25 MG tablet Take 25 mg by mouth 2 (two) times daily with a meal.   Yes Historical Provider, MD  DM-Phenylephrine-Acetaminophen 10-5-325 MG TABS Take 2 tablets by mouth every 4 (four) hours as needed (for cold).   Yes Historical Provider, MD  enalapril (VASOTEC) 20 MG tablet Take 20 mg by mouth 2 (two) times daily.   Yes Historical Provider, MD   fluticasone (FLONASE) 50 MCG/ACT nasal spray Place 2 sprays into both nostrils 2 (two) times daily as needed for allergies or rhinitis.   Yes Historical Provider, MD  furosemide (LASIX) 40 MG tablet Take 40 mg by mouth 2 (two) times daily.   Yes Historical Provider, MD  hydrALAZINE (APRESOLINE) 50 MG tablet Take 50 mg by mouth 3 (three) times daily.   Yes Historical Provider, MD  LORazepam (ATIVAN) 1 MG tablet Take 1 mg by mouth every 6 (six) hours as needed for anxiety.   Yes Historical Provider, MD  pravastatin (PRAVACHOL) 40 MG tablet Take 40 mg by mouth daily.   Yes Historical Provider, MD  spironolactone (ALDACTONE) 25 MG tablet Take 25 mg by mouth daily.   Yes Historical Provider, MD  traZODone (DESYREL) 50 MG tablet Take 50-100 mg by mouth at bedtime.   Yes Historical Provider, MD   Physical Exam: Filed Vitals:   06/09/14 1151 06/09/14 1337 06/09/14 1400 06/09/14 1433  BP:  178/99 179/117 168/98  Pulse:   124   Temp:      TempSrc:  Resp:  20    SpO2: 95% 94% 92% 95%    Physical Exam  Constitutional: Appears well-developed and well-nourished. No distress.  HENT: Normocephalic. No tonsillar erythema or exudates Eyes: Conjunctivae and EOM are normal. PERRLA, no scleral icterus.  Neck: Normal ROM. Neck supple. No JVD. No tracheal deviation. No thyromegaly.  CVS: RRR, S1/S2 appreciated   Pulmonary: Effort and breath sounds normal, no stridor, rhonchi, wheezes, rales.  Abdominal: Soft. BS +,  no distension, tenderness, rebound or guarding.  Musculoskeletal: Normal range of motion. Trace LE edema but no tenderness.  Lymphadenopathy: No lymphadenopathy noted, cervical, inguinal. Neuro: Alert. Normal reflexes, muscle tone coordination. No focal neurologic deficits. Skin: Skin is warm and dry. No rash noted.  No erythema. No pallor.  Psychiatric: Normal mood and affect. Behavior, judgment, thought content normal.   Labs on Admission:  Basic Metabolic Panel:  Recent Labs Lab  06/09/14 1329  NA 139  K 3.7  CL 104  CO2 25  GLUCOSE 101*  BUN 18  CREATININE 1.14*  CALCIUM 9.1   Liver Function Tests:  Recent Labs Lab 06/09/14 1329  AST 24  ALT 23  ALKPHOS 56  BILITOT 0.4  PROT 7.0  ALBUMIN 3.8   No results for input(s): LIPASE, AMYLASE in the last 168 hours. No results for input(s): AMMONIA in the last 168 hours. CBC:  Recent Labs Lab 06/09/14 1329  WBC 11.4*  NEUTROABS 8.7*  HGB 9.5*  HCT 31.0*  MCV 82.7  PLT 268   Cardiac Enzymes: No results for input(s): CKTOTAL, CKMB, CKMBINDEX, TROPONINI in the last 168 hours. BNP: Invalid input(s): POCBNP CBG: No results for input(s): GLUCAP in the last 168 hours.  If 7PM-7AM, please contact night-coverage [email or web address] Password [facility] 06/09/2014, 3:22 PM

## 2014-06-10 ENCOUNTER — Inpatient Hospital Stay (HOSPITAL_COMMUNITY): Payer: Self-pay

## 2014-06-10 ENCOUNTER — Encounter (HOSPITAL_COMMUNITY): Payer: Self-pay

## 2014-06-10 DIAGNOSIS — I5021 Acute systolic (congestive) heart failure: Secondary | ICD-10-CM | POA: Insufficient documentation

## 2014-06-10 DIAGNOSIS — I509 Heart failure, unspecified: Secondary | ICD-10-CM

## 2014-06-10 DIAGNOSIS — I10 Essential (primary) hypertension: Secondary | ICD-10-CM

## 2014-06-10 DIAGNOSIS — I5043 Acute on chronic combined systolic (congestive) and diastolic (congestive) heart failure: Principal | ICD-10-CM

## 2014-06-10 DIAGNOSIS — N179 Acute kidney failure, unspecified: Secondary | ICD-10-CM

## 2014-06-10 LAB — CBC
HCT: 28.4 % — ABNORMAL LOW (ref 36.0–46.0)
HEMOGLOBIN: 8.9 g/dL — AB (ref 12.0–15.0)
MCH: 26 pg (ref 26.0–34.0)
MCHC: 31.3 g/dL (ref 30.0–36.0)
MCV: 83 fL (ref 78.0–100.0)
Platelets: 244 10*3/uL (ref 150–400)
RBC: 3.42 MIL/uL — AB (ref 3.87–5.11)
RDW: 18.5 % — AB (ref 11.5–15.5)
WBC: 10.8 10*3/uL — ABNORMAL HIGH (ref 4.0–10.5)

## 2014-06-10 LAB — COMPREHENSIVE METABOLIC PANEL
ALT: 19 U/L (ref 0–35)
AST: 18 U/L (ref 0–37)
Albumin: 3.3 g/dL — ABNORMAL LOW (ref 3.5–5.2)
Alkaline Phosphatase: 48 U/L (ref 39–117)
Anion gap: 8 (ref 5–15)
BUN: 21 mg/dL (ref 6–23)
CALCIUM: 8.3 mg/dL — AB (ref 8.4–10.5)
CO2: 27 mmol/L (ref 19–32)
CREATININE: 1.32 mg/dL — AB (ref 0.50–1.10)
Chloride: 102 mmol/L (ref 96–112)
GFR, EST AFRICAN AMERICAN: 56 mL/min — AB (ref >90)
GFR, EST NON AFRICAN AMERICAN: 48 mL/min — AB (ref >90)
Glucose, Bld: 87 mg/dL (ref 70–99)
Potassium: 4 mmol/L (ref 3.5–5.1)
Sodium: 137 mmol/L (ref 135–145)
Total Bilirubin: 0.4 mg/dL (ref 0.3–1.2)
Total Protein: 6.2 g/dL (ref 6.0–8.3)

## 2014-06-10 LAB — HEPARIN LEVEL (UNFRACTIONATED): Heparin Unfractionated: <0.1 IU/mL — ABNORMAL LOW (ref 0.30–0.70)

## 2014-06-10 LAB — TROPONIN I: TROPONIN I: 0.21 ng/mL — AB (ref <0.031)

## 2014-06-10 MED ORDER — HEPARIN (PORCINE) IN NACL 100-0.45 UNIT/ML-% IJ SOLN
1300.0000 [IU]/h | INTRAMUSCULAR | Status: DC
  Administered 2014-06-10: 1300 [IU]/h via INTRAVENOUS
  Filled 2014-06-10: qty 250

## 2014-06-10 MED ORDER — HEPARIN SODIUM (PORCINE) 5000 UNIT/ML IJ SOLN
5000.0000 [IU] | Freq: Three times a day (TID) | INTRAMUSCULAR | Status: DC
  Administered 2014-06-10 – 2014-06-12 (×6): 5000 [IU] via SUBCUTANEOUS
  Filled 2014-06-10 (×7): qty 1

## 2014-06-10 MED ORDER — HEPARIN BOLUS VIA INFUSION
2000.0000 [IU] | Freq: Once | INTRAVENOUS | Status: AC
Start: 2014-06-10 — End: 2014-06-10
  Administered 2014-06-10: 2000 [IU] via INTRAVENOUS
  Filled 2014-06-10: qty 2000

## 2014-06-10 MED ORDER — IOHEXOL 350 MG/ML SOLN
100.0000 mL | Freq: Once | INTRAVENOUS | Status: AC | PRN
  Administered 2014-06-10: 100 mL via INTRAVENOUS

## 2014-06-10 NOTE — Progress Notes (Signed)
PROGRESS NOTE    [name] BTD:[id number] DOB: 10-Dec-1970 DOA: 06/09/2014 PCP: Pcp Not In System  HPI/Brief narrative 44 year old female patient with history of presumed nonischemic cardiomyopathy, chronic systolic CHF, HTN, DM, CVA 2010, tobacco abuse, admitted with dyspnea, cough, chest congestion and fevers. She also had right-sided rib cage pain and chest pain with coughing. She was admitted for acute respiratory failure related to acute on chronic systolic CHF.   Assessment/Plan:  1. Acute on chronic systolic CHF: Likely precipitated by acute respiratory illness. Improved with IV Lasix but creatinine trending up. Cardiology following and holding Lasix today. Follow 2-D echo. 2. Elevated troponin: Likely from demand ischemia related to decompensated CHF and SIRS. IV heparin discontinued. Requesting cardiac cath records from [facility]. As per cardiology, low likelihood of CAD. 3. Nonischemic cardiomyopathy: LVEF <35% in the past. Await 2-D echo. 4. Acute on stage III chronic kidney disease: Creatinine jumped from 1.14 > 1.32 due to diuresis and contrast. Diuretics hold. Follow BMP in a.m. 5. SIRS: Present on admission. No acute findings on chest x-ray or CTA chest. Possibly from acute bronchitis. Improved. Continue azithromycin. 6. Essential hypertension: Better controlled. Continue Coreg, hydralazine, Spiriva lactone and lisinopril. Follow BMP in a.m. 7. Anemia of chronic disease: Stable. Follow CBC in a.m. 8. Dyslipidemia: Continue statins    Code Status: Full Family Communication: None at bedside Disposition Plan: Home in medically stable   Consultants:  Cardiology  Procedures:  None  Antibiotics:  Azithromycin   Subjective: Feels much better. Cough minimal and white in color. Denies chest pain. Chest feels congested. But able to bring up sputum.  Objective: Filed Vitals:   06/09/14 2012 06/10/14 0120 06/10/14 0549 06/10/14 1415  BP: 138/89  123/83 145/67    Pulse: 118   84  Temp: 98.5 F (36.9 C)  98.5 F (36.9 C)   TempSrc: Oral  Oral   Resp: 20  18   Height:      Weight:   102.241 kg (225 lb 6.4 oz)   SpO2: 95% 92% 96% 97%    Intake/Output Summary (Last 24 hours) at 06/10/14 1833 Last data filed at 06/10/14 1400  Gross per 24 hour  Intake 1309.96 ml  Output   2050 ml  Net -740.04 ml   Filed Weights   06/09/14 1648 06/10/14 0549  Weight: 105.1 kg (231 lb 11.3 oz) 102.241 kg (225 lb 6.4 oz)     Exam:  General exam: Pleasant young female sitting up comfortably in bed Respiratory system: Slightly diminished breath sounds in the bases but otherwise clear to auscultation. No increased work of breathing. Cardiovascular system: S1 & S2 heard, RRR. No JVD, murmurs, gallops, clicks or pedal edema. Telemetry: Sinus rhythm in the 90s. Occasional sinus tachycardia up to 120s. Gastrointestinal system: Abdomen is nondistended, soft and nontender. Normal bowel sounds heard. Central nervous system: Alert and oriented. No focal neurological deficits. Extremities: Symmetric 5 x 5 power.   Data Reviewed: Basic Metabolic Panel:  Recent Labs Lab 06/09/14 1329 06/10/14 0444  NA 139 137  K 3.7 4.0  CL 104 102  CO2 25 27  GLUCOSE 101* 87  BUN 18 21  CREATININE 1.14* 1.32*  CALCIUM 9.1 8.3*   Liver Function Tests:  Recent Labs Lab 06/09/14 1329 06/10/14 0444  AST 24 18  ALT 23 19  ALKPHOS 56 48  BILITOT 0.4 0.4  PROT 7.0 6.2  ALBUMIN 3.8 3.3*   No results for input(s): LIPASE, AMYLASE in the last 168 hours. No  results for input(s): AMMONIA in the last 168 hours. CBC:  Recent Labs Lab 06/09/14 1329 06/10/14 0444  WBC 11.4* 10.8*  NEUTROABS 8.7*  --   HGB 9.5* 8.9*  HCT 31.0* 28.4*  MCV 82.7 83.0  PLT 268 244   Cardiac Enzymes:  Recent Labs Lab 06/09/14 1708 06/09/14 2157 06/10/14 0444  TROPONINI 0.27* 0.25* 0.21*   BNP (last 3 results) No results for input(s): PROBNP in the last 8760 hours. CBG: No  results for input(s): GLUCAP in the last 168 hours.  No results found for this or any previous visit (from the past 240 hour(s)).         Studies: Dg Chest 2 View  06/09/2014   CLINICAL DATA:  Upper respiratory tract infection with back pain  EXAM: CHEST  2 VIEW  COMPARISON:  11/04/2008  FINDINGS: Cardiac shadow remains enlarged but stable. Mild vascular congestion is seen with interstitial edema consistent with mild CHF. No focal infiltrate or sizable effusion is noted.  IMPRESSION: Mild CHF.   Electronically Signed   By: Inez Catalina M.D.   On: 06/09/2014 13:09   Ct Angio Chest Pe W/cm &/or Wo Cm  06/10/2014   CLINICAL DATA:  Chest pain, shortness of breath, hypoxia. Elevated troponin.  EXAM: CT ANGIOGRAPHY CHEST WITH CONTRAST  TECHNIQUE: Multidetector CT imaging of the chest was performed using the standard protocol during bolus administration of intravenous contrast. Multiplanar CT image reconstructions and MIPs were obtained to evaluate the vascular anatomy.  CONTRAST:  170mL OMNIPAQUE IOHEXOL 350 MG/ML SOLN  COMPARISON:  07/20/2008  FINDINGS: Technically adequate study with good opacification of the central and segmental pulmonary arteries. No focal filling defects. No evidence of significant pulmonary embolus.  Diffuse cardiac enlargement with left ventricular hypertrophy. Enlarged main pulmonary artery suggesting arterial hypertension. Normal caliber thoracic aorta without evidence of dissection. Esophagus is decompressed. No significant lymphadenopathy in the chest. Reflux of contrast material into the inferior vena cava and hepatic veins suggest passive congestion due to heart failure.  Mosaic attenuation pattern in the lungs may represent edema. No focal consolidation. Airways appear patent. No pleural effusions. No pneumothorax.  Included portions of the upper abdominal organs are grossly unremarkable. No destructive bone lesions.  Review of the MIP images confirms the above findings.   IMPRESSION: No evidence of significant pulmonary embolus. Cardiac enlargement with left ventricular hypertrophy. Probable pulmonary arterial hypertension. Mosaic attenuation in the lungs may indicate edema.   Electronically Signed   By: Lucienne Capers M.D.   On: 06/10/2014 01:02        Scheduled Meds: . azithromycin  500 mg Intravenous Q24H  . carvedilol  6.25 mg Oral BID WC  . enalapril  20 mg Oral BID  . heparin subcutaneous  5,000 Units Subcutaneous 3 times per day  . hydrALAZINE  50 mg Oral TID  . pravastatin  40 mg Oral Daily  . sodium chloride  3 mL Intravenous Q12H  . spironolactone  25 mg Oral Daily  . traZODone  50-100 mg Oral QHS   Continuous Infusions: . sodium chloride 10 mL/hr at 06/09/14 2018    Principal Problem:   Acute respiratory failure with hypoxia Active Problems:   HYPERTENSION, BENIGN ESSENTIAL   Tachycardia   Acute on chronic combined systolic and diastolic heart failure   CKD (chronic kidney disease) stage 3, GFR 30-59 ml/min   Leukocytosis   Anemia of chronic disease   Dyslipidemia   Troponin level elevated    Time spent: 25 minutes.  Vernell Leep, MD, FACP, FHM. [facility] Pager [phone]  If 7PM-7AM, please contact night-coverage [email or web address] Password [facility] 06/10/2014, 6:33 PM    LOS: 1 day

## 2014-06-10 NOTE — Progress Notes (Signed)
  Echocardiogram 2D Echocardiogram has been performed.  [name] 06/10/2014, 11:03 AM

## 2014-06-10 NOTE — Progress Notes (Signed)
    Subjective:  Feeling better. No chest pain. Breathing is better, cough continues.   Objective:  Vital Signs in the last 24 hours: Temp:  [98.5 F (36.9 C)-98.9 F (37.2 C)] 98.5 F (36.9 C) (02/24 0549) Pulse Rate:  [118-124] 118 (02/23 2012) Resp:  [17-24] 18 (02/24 0549) BP: (123-179)/(83-130) 123/83 mmHg (02/24 0549) SpO2:  [92 %-99 %] 96 % (02/24 0549) Weight:  [225 lb 6.4 oz (102.241 kg)-231 lb 11.3 oz (105.1 kg)] 225 lb 6.4 oz (102.241 kg) (02/24 0549)  Intake/Output from previous day: 02/23 0701 - 02/24 0700 In: 920 [P.O.:720; I.V.:200] Out: 1750 [Urine:1750]  Physical Exam: Pt is alert and oriented, pleasant obese woman in NAD HEENT: normal Neck: JVP - normal Lungs: coarse bilaterally CV: RRR without murmur or gallop Abd: soft, NT, Positive BS, no hepatomegaly Ext: no C/C/E, distal pulses intact and equal Skin: warm/dry no rash   Lab Results:  Recent Labs  06/09/14 1329 06/10/14 0444  WBC 11.4* 10.8*  HGB 9.5* 8.9*  PLT 268 244    Recent Labs  06/09/14 1329 06/10/14 0444  NA 139 137  K 3.7 4.0  CL 104 102  CO2 25 27  GLUCOSE 101* 87  BUN 18 21  CREATININE 1.14* 1.32*    Recent Labs  06/09/14 2157 06/10/14 0444  TROPONINI 0.25* 0.21*    Cardiac Studies: 2D Echo pending  Tele: Sinus rhythm, personally reviewed.   Assessment/Plan:  1. Acute on chronic systolic heart failure - this is likely secondary to acute respiratory illness in patient with typical SIRS-like syndrome. Creatinine trending up after IV lasix and with Chest CTA yesterday would hold lasix today and follow creatinine in am. Continue baseline cardiac meds. Await 2D echo. Does not appear grossly volume overloaded.  2. Elevated troponin. Low-level flat troponin trend most consistent with demand ischemia from CHF/SIRS. Stop heparin. Will request cath records from [facility]. Low likelihood of CAD.  3. Nonischemic CM - LVEF < 35% in past. Await 2D echo.  Plan:   Change  IV heparin to DVT-prophylaxis dose  Hold lasix  Get records from [facility]  Repeat BMET in am  Await 2D Echo  Sherren Mocha, M.D. 06/10/2014, 7:33 AM

## 2014-06-10 NOTE — Progress Notes (Signed)
ANTICOAGULATION CONSULT NOTE - Follow Up Consult  Pharmacy Consult for Heparin Indication: chest pain/ACS and pulmonary embolus  Allergies  Allergen Reactions  . Sulfonamide Derivatives Hives    Patient Measurements: Height: 5\' 4"  (162.6 cm) Weight: 225 lb 6.4 oz (102.241 kg) IBW/kg (Calculated) : 54.7 Heparin Dosing Weight:   Vital Signs: Temp: 98.5 F (36.9 C) (02/24 0549) Temp Source: Oral (02/24 0549) BP: 123/83 mmHg (02/24 0549) Pulse Rate: 118 (02/23 2012)  Labs:  Recent Labs  06/09/14 1329 06/09/14 1708 06/09/14 2157 06/10/14 0400 06/10/14 0444  HGB 9.5*  --   --   --  8.9*  HCT 31.0*  --   --   --  28.4*  PLT 268  --   --   --  244  APTT  --   --  33  --   --   LABPROT  --   --  14.6  --   --   INR  --   --  1.13  --   --   HEPARINUNFRC  --   --   --  <0.10*  --   CREATININE 1.14*  --   --   --  1.32*  TROPONINI  --  0.27* 0.25*  --  0.21*    Estimated Creatinine Clearance: 63.3 mL/min (by C-G formula based on Cr of 1.32).   Medications:  Infusions:  . sodium chloride 10 mL/hr at 06/09/14 2018  . heparin      Assessment: Patient with low heparin level.  No issues per RN.    Goal of Therapy:  Heparin level 0.3-0.7 units/ml Monitor platelets by anticoagulation protocol: Yes   Plan:  Heparin bolus 2000 units iv x1 Heparin drip at  1300 units/hr Next heparin level at  [name], [name] 06/10/2014,6:57 AM

## 2014-06-10 NOTE — Care Management Note (Addendum)
    Page 1 of 1   06/12/2014     3:25:36 PM CARE MANAGEMENT NOTE 06/12/2014  Patient:  [name], [name]   Account Number:  [id number]  Date Initiated:  06/10/2014  Documentation initiated by:  Dessa Phi  Subjective/Objective Assessment:   44 y/o f admitted w/Acute resp failure w/hypoxia,chf,tachycardia.     Action/Plan:   From home.   Anticipated DC Date:  06/12/2014   Anticipated DC Plan:  HOME/SELF CARE  In-house referral  [facility]  CM consult  [facility]  Medication Assistance      Choice offered to / List presented to:             Status of service:  Completed, signed off Medicare Important Message given?   (If response is "NO", the following Medicare IM given date fields will be blank) Date Medicare IM given:   Medicare IM given by:   Date Additional Medicare IM given:   Additional Medicare IM given by:    Discharge Disposition:  HOME/SELF CARE  Per UR Regulation:  Reviewed for med. necessity/level of care/duration of stay  If discussed at [place] of Stay Meetings, dates discussed:    Comments:  06/12/14 Dessa Phi RN BSN NCM [phone] Patient provided w/pcp listing, chose [facility] no longer accepting hospital f/u appts to be set, now patient's must walk in m-f 9a-5p. Patient voiced understanding.also provided patient w/health insurance info, & $4 [facility] med list.No further d/c needs.  06/10/14 Dessa Phi RN BSN NCM [phone] Will provide w/resources-insurance info-seen by financial counselor-medicaid potential. $4 [facility] med list, PCP listing-encourage [place].Monitor progress for d/c needs.

## 2014-06-11 DIAGNOSIS — R0781 Pleurodynia: Secondary | ICD-10-CM | POA: Insufficient documentation

## 2014-06-11 LAB — CBC
HCT: 29.8 % — ABNORMAL LOW (ref 36.0–46.0)
HEMOGLOBIN: 9 g/dL — AB (ref 12.0–15.0)
MCH: 25.3 pg — ABNORMAL LOW (ref 26.0–34.0)
MCHC: 30.2 g/dL (ref 30.0–36.0)
MCV: 83.7 fL (ref 78.0–100.0)
Platelets: 277 10*3/uL (ref 150–400)
RBC: 3.56 MIL/uL — ABNORMAL LOW (ref 3.87–5.11)
RDW: 18.5 % — ABNORMAL HIGH (ref 11.5–15.5)
WBC: 8.1 10*3/uL (ref 4.0–10.5)

## 2014-06-11 LAB — BASIC METABOLIC PANEL
ANION GAP: 4 — AB (ref 5–15)
BUN: 23 mg/dL (ref 6–23)
CHLORIDE: 101 mmol/L (ref 96–112)
CO2: 27 mmol/L (ref 19–32)
CREATININE: 1.37 mg/dL — AB (ref 0.50–1.10)
Calcium: 8.2 mg/dL — ABNORMAL LOW (ref 8.4–10.5)
GFR calc Af Amer: 53 mL/min — ABNORMAL LOW (ref >90)
GFR calc non Af Amer: 46 mL/min — ABNORMAL LOW (ref >90)
Glucose, Bld: 95 mg/dL (ref 70–99)
Potassium: 4.3 mmol/L (ref 3.5–5.1)
Sodium: 132 mmol/L — ABNORMAL LOW (ref 135–145)

## 2014-06-11 LAB — GLUCOSE, CAPILLARY: Glucose-Capillary: 101 mg/dL — ABNORMAL HIGH (ref 70–99)

## 2014-06-11 MED ORDER — CARVEDILOL 6.25 MG PO TABS
18.7500 mg | ORAL_TABLET | Freq: Once | ORAL | Status: AC
  Administered 2014-06-11: 18.75 mg via ORAL
  Filled 2014-06-11 (×2): qty 1

## 2014-06-11 MED ORDER — FUROSEMIDE 40 MG PO TABS
40.0000 mg | ORAL_TABLET | Freq: Two times a day (BID) | ORAL | Status: DC
  Administered 2014-06-11: 40 mg via ORAL
  Filled 2014-06-11: qty 1

## 2014-06-11 MED ORDER — ACETAMINOPHEN 325 MG PO TABS
650.0000 mg | ORAL_TABLET | Freq: Three times a day (TID) | ORAL | Status: DC
  Administered 2014-06-11 – 2014-06-12 (×4): 650 mg via ORAL
  Filled 2014-06-11 (×5): qty 2

## 2014-06-11 MED ORDER — FUROSEMIDE 40 MG PO TABS
40.0000 mg | ORAL_TABLET | Freq: Two times a day (BID) | ORAL | Status: DC
Start: 2014-06-12 — End: 2014-06-12
  Administered 2014-06-12: 40 mg via ORAL
  Filled 2014-06-11 (×2): qty 1

## 2014-06-11 MED ORDER — ENALAPRIL MALEATE 20 MG PO TABS
20.0000 mg | ORAL_TABLET | Freq: Two times a day (BID) | ORAL | Status: DC
  Filled 2014-06-11: qty 1

## 2014-06-11 MED ORDER — CARVEDILOL 25 MG PO TABS
25.0000 mg | ORAL_TABLET | Freq: Two times a day (BID) | ORAL | Status: DC
  Administered 2014-06-11 – 2014-06-12 (×2): 25 mg via ORAL
  Filled 2014-06-11 (×2): qty 1

## 2014-06-11 NOTE — Progress Notes (Signed)
PROGRESS NOTE    [name] ZOX:[id number] DOB: 03/31/1971 DOA: 06/09/2014 PCP: Pcp Not In System  HPI/Brief narrative 44 year old female patient with history of presumed nonischemic cardiomyopathy, chronic systolic CHF, HTN, DM, CVA 2010, tobacco abuse, admitted with dyspnea, cough, chest congestion and fevers. She also had right-sided rib cage pain and chest pain with coughing. She was admitted for acute respiratory failure related to acute on chronic systolic CHF.   Assessment/Plan:  1. Acute on chronic systolic CHF: Likely precipitated by acute respiratory illness. Improved with IV Lasix but creatinine trending up. Cardiology following. Transitioned to oral Lasix-hold p.m. dose. 2-D echo: EF 20-25 percent and severe hypokinesis of entire LV myocardium. Improved. Continue ACEI, Aldactone. 2. Elevated troponin: Likely from demand ischemia related to decompensated CHF and SIRS. IV heparin discontinued. As per cardiology, low likelihood of CAD. Dr. Burt Knack reviewed cath notes from 2011-normal coronaries and hence nonischemic cardiomyopathy. 3. Nonischemic cardiomyopathy: LVEF <35% in the past. Await 2-D echo. 4. Acute on stage III chronic kidney disease: Creatinine jumped from 1.14 > 1.32 due to diuresis and contrast. Diuretics hold. Creatinine stable at 1.3. Hold evening dose of Lasix and ACEI. Follow BMP in a.m. 5. SIRS: Present on admission. No acute findings on chest x-ray or CTA chest. Possibly from acute bronchitis. Improved. Continue azithromycin. Patient complains of pleuritic chest pain on coughing. Scheduled acetaminophen. 6. Essential hypertension: Better controlled. Continue Coreg, hydralazine, Spironolactone and lisinopril. Follow BMP in a.m. 7. Anemia of chronic disease: Stable.  8. Dyslipidemia: Continue statins    Code Status: Full Family Communication: None at bedside Disposition Plan: Home possibly  2/26   Consultants:  Cardiology  Procedures:  None  Antibiotics:  Azithromycin   Subjective: Minimal dry cough. No dyspnea. Anterior chest pain only on coughing.  Objective: Filed Vitals:   06/10/14 2207 06/11/14 0300 06/11/14 0532 06/11/14 1557  BP: 101/58 134/80 124/79 105/76  Pulse:   106 99  Temp:   98.6 F (37 C) 98.1 F (36.7 C)  TempSrc:   Oral Oral  Resp:   18 18  Height:      Weight:   102.9 kg (226 lb 13.7 oz)   SpO2:   94% 94%    Intake/Output Summary (Last 24 hours) at 06/11/14 1750 Last data filed at 06/11/14 1500  Gross per 24 hour  Intake   1210 ml  Output    900 ml  Net    310 ml   Filed Weights   06/09/14 1648 06/10/14 0549 06/11/14 0532  Weight: 105.1 kg (231 lb 11.3 oz) 102.241 kg (225 lb 6.4 oz) 102.9 kg (226 lb 13.7 oz)     Exam:  General exam: Pleasant young female sitting up comfortably in bed Respiratory system: Slightly diminished breath sounds in the bases but otherwise clear to auscultation. No increased work of breathing. Cardiovascular system: S1 & S2 heard, RRR. No JVD, murmurs, gallops, clicks or pedal edema. Telemetry: Sinus rhythm in the 90s.  Gastrointestinal system: Abdomen is nondistended, soft and nontender. Normal bowel sounds heard. Central nervous system: Alert and oriented. No focal neurological deficits. Extremities: Symmetric 5 x 5 power.   Data Reviewed: Basic Metabolic Panel:  Recent Labs Lab 06/09/14 1329 06/10/14 0444 06/11/14 0525  NA 139 137 132*  K 3.7 4.0 4.3  CL 104 102 101  CO2 25 27 27   GLUCOSE 101* 87 95  BUN 18 21 23   CREATININE 1.14* 1.32* 1.37*  CALCIUM 9.1 8.3* 8.2*   Liver Function Tests:  Recent  Labs Lab 06/09/14 1329 06/10/14 0444  AST 24 18  ALT 23 19  ALKPHOS 56 48  BILITOT 0.4 0.4  PROT 7.0 6.2  ALBUMIN 3.8 3.3*   No results for input(s): LIPASE, AMYLASE in the last 168 hours. No results for input(s): AMMONIA in the last 168 hours. CBC:  Recent Labs Lab  06/09/14 1329 06/10/14 0444 06/11/14 0525  WBC 11.4* 10.8* 8.1  NEUTROABS 8.7*  --   --   HGB 9.5* 8.9* 9.0*  HCT 31.0* 28.4* 29.8*  MCV 82.7 83.0 83.7  PLT 268 244 277   Cardiac Enzymes:  Recent Labs Lab 06/09/14 1708 06/09/14 2157 06/10/14 0444  TROPONINI 0.27* 0.25* 0.21*   BNP (last 3 results) No results for input(s): PROBNP in the last 8760 hours. CBG:  Recent Labs Lab 06/11/14 0752  GLUCAP 101*    No results found for this or any previous visit (from the past 240 hour(s)).         Studies: Ct Angio Chest Pe W/cm &/or Wo Cm  06/10/2014   CLINICAL DATA:  Chest pain, shortness of breath, hypoxia. Elevated troponin.  EXAM: CT ANGIOGRAPHY CHEST WITH CONTRAST  TECHNIQUE: Multidetector CT imaging of the chest was performed using the standard protocol during bolus administration of intravenous contrast. Multiplanar CT image reconstructions and MIPs were obtained to evaluate the vascular anatomy.  CONTRAST:  1102mL OMNIPAQUE IOHEXOL 350 MG/ML SOLN  COMPARISON:  07/20/2008  FINDINGS: Technically adequate study with good opacification of the central and segmental pulmonary arteries. No focal filling defects. No evidence of significant pulmonary embolus.  Diffuse cardiac enlargement with left ventricular hypertrophy. Enlarged main pulmonary artery suggesting arterial hypertension. Normal caliber thoracic aorta without evidence of dissection. Esophagus is decompressed. No significant lymphadenopathy in the chest. Reflux of contrast material into the inferior vena cava and hepatic veins suggest passive congestion due to heart failure.  Mosaic attenuation pattern in the lungs may represent edema. No focal consolidation. Airways appear patent. No pleural effusions. No pneumothorax.  Included portions of the upper abdominal organs are grossly unremarkable. No destructive bone lesions.  Review of the MIP images confirms the above findings.  IMPRESSION: No evidence of significant pulmonary  embolus. Cardiac enlargement with left ventricular hypertrophy. Probable pulmonary arterial hypertension. Mosaic attenuation in the lungs may indicate edema.   Electronically Signed   By: Lucienne Capers M.D.   On: 06/10/2014 01:02        Scheduled Meds: . acetaminophen  650 mg Oral TID  . azithromycin  500 mg Intravenous Q24H  . carvedilol  25 mg Oral BID WC  . [START ON 06/12/2014] enalapril  20 mg Oral BID  . [START ON 06/12/2014] furosemide  40 mg Oral BID  . heparin subcutaneous  5,000 Units Subcutaneous 3 times per day  . hydrALAZINE  50 mg Oral TID  . pravastatin  40 mg Oral Daily  . sodium chloride  3 mL Intravenous Q12H  . spironolactone  25 mg Oral Daily  . traZODone  50-100 mg Oral QHS   Continuous Infusions: . sodium chloride 10 mL/hr at 06/10/14 2212    Principal Problem:   Acute respiratory failure with hypoxia Active Problems:   HYPERTENSION, BENIGN ESSENTIAL   Tachycardia   Acute on chronic combined systolic and diastolic heart failure   CKD (chronic kidney disease) stage 3, GFR 30-59 ml/min   Leukocytosis   Anemia of chronic disease   Dyslipidemia   Troponin level elevated   Acute systolic CHF (congestive heart failure)  Acute kidney injury    Time spent: 25 minutes.    Vernell Leep, MD, FACP, FHM. [facility] Pager [phone]  If 7PM-7AM, please contact night-coverage [email or web address] Password [email or web address] 06/11/2014, 5:50 PM    LOS: 2 days

## 2014-06-11 NOTE — Progress Notes (Addendum)
    Subjective:  The patient is feeling better. She still has a cough. Her breathing is improving. She has no chest pain.  Objective:  Vital Signs in the last 24 hours: Temp:  [98.6 F (37 C)] 98.6 F (37 C) (02/25 0532) Pulse Rate:  [84-106] 106 (02/25 0532) Resp:  [18] 18 (02/25 0532) BP: (101-145)/(58-80) 124/79 mmHg (02/25 0532) SpO2:  [93 %-97 %] 94 % (02/25 0532) Weight:  [226 lb 13.7 oz (102.9 kg)] 226 lb 13.7 oz (102.9 kg) (02/25 0532)  Intake/Output from previous day: 02/24 0701 - 02/25 0700 In: 1050 [P.O.:560; I.V.:240; IV Piggyback:250] Out: 1100 [Urine:1100]  Physical Exam: Pt is alert and oriented, pleasant overweight woman in NAD HEENT: normal Neck: JVP - normal Lungs: CTA bilaterally CV: RRR without murmur or gallop Abd: soft, NT, Positive BS, no hepatomegaly Ext: no C/C/E, distal pulses intact and equal Skin: warm/dry no rash   Lab Results:  Recent Labs  06/10/14 0444 06/11/14 0525  WBC 10.8* 8.1  HGB 8.9* 9.0*  PLT 244 277    Recent Labs  06/10/14 0444 06/11/14 0525  NA 137 132*  K 4.0 4.3  CL 102 101  CO2 27 27  GLUCOSE 87 95  BUN 21 23  CREATININE 1.32* 1.37*    Recent Labs  06/09/14 2157 06/10/14 0444  TROPONINI 0.25* 0.21*    Cardiac Studies: 2D Echo: Study Conclusions  - Left ventricle: The cavity size was normal. Wall thickness was increased in a pattern of severe LVH. Systolic function was severely reduced. The estimated ejection fraction was in the range of 20% to 25%. Severe hypokinesis of the entire myocardium. - Mitral valve: There was mild regurgitation. - Left atrium: The atrium was severely dilated  Tele: Sinus tachycardia, personally reviewed.  Assessment/Plan:  1. Acute on chronic systolic heart failure in the setting of an acute respiratory illness 2. Elevated troponin, Low-level flat troponin trend most consistent with demand ischemia from CHF/SIRS. 3. Nonischemic cardiomyopathy. Severe LV  dysfunction noted on echo. This is not a new finding. She has severe LVH. There is severe global hypokinesis suggestive of a nonischemic etiology. Her cardiac catheterization report from [facility] is going to be sent to the cardiac cath lab at [facility] and I will review this as soon as we receive it.  The patient appears stable from a cardiac perspective. She is on a good medical regimen at home. This was reviewed today. She had run out of her medications because of transition to [place] from [facility]. I am going to make arrangements for her to follow-up in the advanced heart failure clinic. Would place back on her home dose of carvedilol 25 mg twice a day and add back furosemide 40 mg twice a day.   Sherren Mocha, M.D. 06/11/2014, 7:34 AM

## 2014-06-12 DIAGNOSIS — J209 Acute bronchitis, unspecified: Secondary | ICD-10-CM

## 2014-06-12 LAB — RESPIRATORY VIRUS PANEL
ADENOVIRUS: NEGATIVE
INFLUENZA A: NEGATIVE
Influenza B: NEGATIVE
Metapneumovirus: NEGATIVE
PARAINFLUENZA 1 A: NEGATIVE
Parainfluenza 2: NEGATIVE
Parainfluenza 3: NEGATIVE
RESPIRATORY SYNCYTIAL VIRUS A: NEGATIVE
RESPIRATORY SYNCYTIAL VIRUS B: NEGATIVE
Rhinovirus: NEGATIVE

## 2014-06-12 LAB — BASIC METABOLIC PANEL
ANION GAP: 3 — AB (ref 5–15)
BUN: 22 mg/dL (ref 6–23)
CALCIUM: 8.3 mg/dL — AB (ref 8.4–10.5)
CHLORIDE: 107 mmol/L (ref 96–112)
CO2: 26 mmol/L (ref 19–32)
Creatinine, Ser: 1.42 mg/dL — ABNORMAL HIGH (ref 0.50–1.10)
GFR calc Af Amer: 51 mL/min — ABNORMAL LOW (ref >90)
GFR calc non Af Amer: 44 mL/min — ABNORMAL LOW (ref >90)
GLUCOSE: 97 mg/dL (ref 70–99)
POTASSIUM: 4.1 mmol/L (ref 3.5–5.1)
Sodium: 136 mmol/L (ref 135–145)

## 2014-06-12 LAB — GLUCOSE, CAPILLARY: Glucose-Capillary: 133 mg/dL — ABNORMAL HIGH (ref 70–99)

## 2014-06-12 MED ORDER — ACETAMINOPHEN 325 MG PO TABS: 650.0000 mg | ORAL_TABLET | Freq: Four times a day (QID) | ORAL | Status: DC | PRN

## 2014-06-12 MED ORDER — GUAIFENESIN 100 MG/5ML PO SOLN: 200.0000 mg | Freq: Four times a day (QID) | ORAL | Status: DC | PRN

## 2014-06-12 MED ORDER — FUROSEMIDE 40 MG PO TABS: 40.0000 mg | ORAL_TABLET | Freq: Two times a day (BID) | ORAL | Status: DC

## 2014-06-12 MED ORDER — CARVEDILOL 25 MG PO TABS: 25.0000 mg | ORAL_TABLET | Freq: Two times a day (BID) | ORAL | Status: DC

## 2014-06-12 NOTE — Progress Notes (Signed)
Primary service provided EKG strip from yesterday showing 2:1 AV blokc  HR 29 at 14:29 on 06/11/14  Then developped Taycycardia (SVT at approximately 100 bpm  (? Atrial flutter) Patient apparently asymptomatic   Ending not captured/saved to determine duration.  WIll make sure that patient is set up for 30 Day event monitor to follow I would keep appt as planned in cardiology.

## 2014-06-12 NOTE — Discharge Instructions (Signed)

## 2014-06-12 NOTE — Progress Notes (Signed)
    Subjective:  Still with cough. No other complaints.   Objective:  Vital Signs in the last 24 hours: Temp:  [98.1 F (36.7 C)-99 F (37.2 C)] 99 F (37.2 C) (02/26 0527) Pulse Rate:  [87-101] 101 (02/26 0527) Resp:  [18-20] 20 (02/26 0527) BP: (105-139)/(62-84) 139/84 mmHg (02/26 0527) SpO2:  [94 %-98 %] 98 % (02/26 0527) Weight:  [227 lb 1.2 oz (103 kg)] 227 lb 1.2 oz (103 kg) (02/26 0527)  Intake/Output from previous day: 02/25 0701 - 02/26 0700 In: 1050 [P.O.:720; I.V.:80; IV Piggyback:250] Out: 200 [Urine:200]  Physical Exam: Pt is alert and oriented, NAD HEENT: normal Neck: JVP - normal Lungs: CTA bilaterally CV: RRR without murmur or gallop Abd: soft, NT, Positive BS, no hepatomegaly Ext: no C/C/E, distal pulses intact and equal Skin: warm/dry no rash   Lab Results:  Recent Labs  06/10/14 0444 06/11/14 0525  WBC 10.8* 8.1  HGB 8.9* 9.0*  PLT 244 277    Recent Labs  06/11/14 0525 06/12/14 0440  NA 132* 136  K 4.3 4.1  CL 101 107  CO2 27 26  GLUCOSE 95 97  BUN 23 22  CREATININE 1.37* 1.42*    Recent Labs  06/09/14 2157 06/10/14 0444  TROPONINI 0.25* 0.21*   Tele: Sinus tach/sinus rhythm  Assessment/Plan:  1. Acute on chronic systolic heart failure in the setting of an acute respiratory illness. Renal function a little worse with contrast and diuretics. Hold diuretics. No evidence of volume overload on exam.  2. Elevated troponin, Low-level flat troponin trend most consistent with demand ischemia from CHF/SIRS.  3. Nonischemic cardiomyopathy. Severe LV dysfunction noted on echo. This is not a new finding. She has severe LVH. There is severe global hypokinesis suggestive of a nonischemic etiology. Reviewed records from [facility]. Cath in 2011 showed normal coronaries.   Dispo: she has no regular medical care here in [place]. Needs cardiac care and would recommend HF clinic considering her severe cardiomyopathy. She would benefit from  regular NP visits. Would continue carvedilol, lasix, and hydralazine but hold ACE and aldactone until follow-up labs and office visit next week.   Sherren Mocha, M.D. 06/12/2014, 10:09 AM

## 2014-06-12 NOTE — Discharge Summary (Signed)
Physician Discharge Summary  [name] ZOX:[id number] DOB: 1971-02-13 DOA: 06/09/2014  PCP: Pcp Not In System  Admit date: 06/09/2014 Discharge date: 06/12/2014  Time spent: Greater than 30 minutes  Recommendations for Outpatient Follow-up:  1. [facility]: MDs office will arrange for outpatient 30 day event monitor and follow-up mid next week with repeat labs (CBC & BMP). 2. Requested case management to arrange for a new PCP follow-up in 1-2 weeks.  Discharge Diagnoses:  Principal Problem:   Acute respiratory failure with hypoxia Active Problems:   HYPERTENSION, BENIGN ESSENTIAL   Tachycardia   Acute on chronic combined systolic and diastolic heart failure   CKD (chronic kidney disease) stage 3, GFR 30-59 ml/min   Leukocytosis   Anemia of chronic disease   Dyslipidemia   Troponin level elevated   Acute systolic CHF (congestive heart failure)   Acute kidney injury   Pleuritic chest pain   Discharge Condition: Improved & Stable  Diet recommendation: Heart healthy diet.  Filed Weights   06/10/14 0549 06/11/14 0532 06/12/14 0527  Weight: 102.241 kg (225 lb 6.4 oz) 102.9 kg (226 lb 13.7 oz) 103 kg (227 lb 1.2 oz)    History of present illness:  44 year old female patient with history of nonischemic cardiomyopathy, chronic systolic CHF, HTN, DM, CVA 2010, tobacco abuse, admitted with dyspnea, cough, chest congestion and fevers. She also had right-sided rib cage pain and chest pain with coughing. She was admitted for acute respiratory failure related to acute on chronic systolic CHF.  Hospital Course:   1. Acute on chronic systolic CHF: Likely precipitated by acute respiratory illness. Improved with IV Lasix but creatinine trending up. Cardiology following.2-D echo: EF 20-25 percent and severe hypokinesis of entire LV myocardium. Improved. As per cardiology follow-up and discussion with cardiology, discharge home on Lasix/carvedilol/hydralazine but hold enalapril and  Aldactone until outpatient follow-up in cardiology clinic mid next week with repeat labs. 2. Elevated troponin: Likely from demand ischemia related to decompensated CHF and SIRS. IV heparin discontinued. As per cardiology, low likelihood of CAD. Dr. Burt Knack reviewed cath notes from 2011-normal coronaries and hence nonischemic cardiomyopathy. 3. Nonischemic cardiomyopathy: LVEF <35% in the past. Current echo results as below. Continue beta blockers, Lasix and hydralazine. Will need to start ACEI and Aldactone back when renal functions improve. 4. Acute on stage III chronic kidney disease: Creatinine jumped from 1.14 > 1.32 due to diuresis and contrast. Despite temporarily holding last evening's dose of diuretics, creatinine has slightly increased to the 1.4 range. As discussed with cardiology, will DC enalapril and Aldactone but continue Lasix until close outpatient follow-up with repeat BMP in a couple of days. Patient advised low potassium diet. She verbalized understanding. She was also counseled extensively to make sure that she keeps up the follow-up appointment. Counseled to avoid NSAIDs. 5. SIRS/acute bronchitis: Present on admission. No acute findings on chest x-ray or CTA chest. Possibly from acute bronchitis. Improved. Completed 3 days of azithromycin-completed course. No reported chest pain today. RSV panel negative. 6. Essential hypertension: Better controlled. Continue Coreg, hydralazine.  7. Anemia of chronic disease: Stable.  8. Dyslipidemia: Continue statins 9. Transient Mobitz type II AV block: Seen on telemetry on 06/11/14 at 2:29 PM. Patient apparently asymptomatic. As per cardiology review, 2:1 AV block followed by tachycardia 100 bpm (? Atrial flutter). Cardiology will arrange 30 day event monitor and outpatient cardiology follow-up.   Consultations:  Cardiology   Procedures:  None    Discharge Exam:  Complaints:  Minimal dry cough. No  reported chest pain or dyspnea. Denies  any other complaints.   Filed Vitals:   06/11/14 2031 06/12/14 0527 06/12/14 1254 06/12/14 1313  BP: 117/62 139/84 112/58 124/55  Pulse: 87 101 77 92  Temp: 98.1 F (36.7 C) 99 F (37.2 C) 97.9 F (36.6 C) 97.9 F (36.6 C)  TempSrc: Oral Oral Oral Oral  Resp: 18 20 24 18   Height:      Weight:  103 kg (227 lb 1.2 oz)    SpO2: 98% 98% 98% 99%    General exam: Pleasant young female ambulating comfortably in the room. Respiratory system: clear to auscultation. No increased work of breathing. Cardiovascular system: S1 & S2 heard, RRR. No JVD, murmurs, gallops, clicks or pedal edema. Telemetry: Sinus rhythm. Transient Mobitz type II heart block on 2/25 at 2:29 PM (reviewed with Dr. Harrington Challenger, cardiology)  Gastrointestinal system: Abdomen is nondistended, soft and nontender. Normal bowel sounds heard. Central nervous system: Alert and oriented. No focal neurological deficits. Extremities: Symmetric 5 x 5 power.  Discharge Instructions      Discharge Instructions    (HEART FAILURE PATIENTS) Call MD:  Anytime you have any of the following symptoms: 1) 3 pound weight gain in 24 hours or 5 pounds in 1 week 2) shortness of breath, with or without a dry hacking cough 3) swelling in the hands, feet or stomach 4) if you have to sleep on extra pillows at night in order to breathe.    Complete by:  As directed      Activity as tolerated - No restrictions    Complete by:  As directed      Call MD for:  difficulty breathing, headache or visual disturbances    Complete by:  As directed      Call MD for:  extreme fatigue    Complete by:  As directed      Call MD for:  persistant dizziness or light-headedness    Complete by:  As directed      Call MD for:  severe uncontrolled pain    Complete by:  As directed      Call MD for:  temperature >100.4    Complete by:  As directed      Diet - low sodium heart healthy    Complete by:  As directed             Medication List    STOP taking these  medications        DM-Phenylephrine-Acetaminophen 10-5-325 MG Tabs     enalapril 20 MG tablet  Commonly known as:  VASOTEC     LORazepam 1 MG tablet  Commonly known as:  ATIVAN     spironolactone 25 MG tablet  Commonly known as:  ALDACTONE     traZODone 50 MG tablet  Commonly known as:  DESYREL      TAKE these medications        acetaminophen 325 MG tablet  Commonly known as:  TYLENOL  Take 2 tablets (650 mg total) by mouth every 6 (six) hours as needed for mild pain, moderate pain, fever or headache (or Fever >/= 101).     carvedilol 25 MG tablet  Commonly known as:  COREG  Take 1 tablet (25 mg total) by mouth 2 (two) times daily with a meal.     fluticasone 50 MCG/ACT nasal spray  Commonly known as:  FLONASE  Place 2 sprays into both nostrils 2 (two) times daily as needed for allergies or  rhinitis.     furosemide 40 MG tablet  Commonly known as:  LASIX  Take 1 tablet (40 mg total) by mouth 2 (two) times daily.     guaiFENesin 100 MG/5ML Soln  Commonly known as:  ROBITUSSIN  Take 10 mLs (200 mg total) by mouth every 6 (six) hours as needed for cough or to loosen phlegm.     hydrALAZINE 50 MG tablet  Commonly known as:  APRESOLINE  Take 50 mg by mouth 3 (three) times daily.     pravastatin 40 MG tablet  Commonly known as:  PRAVACHOL  Take 40 mg by mouth daily.       Follow-up Information    Follow up with [facility].   Why:  MDs office will call to arrange heart monitor & follow-up for mid next week-to be seen with repeat labs (CBC & BMP). Please call office if you don't hear back from them in 2-3 days.   Contact information:   [address] [place] [address] [phone]       The results of significant diagnostics from this hospitalization (including imaging, microbiology, ancillary and laboratory) are listed below for reference.    Significant Diagnostic Studies: Dg Chest 2 View  06/09/2014   CLINICAL DATA:  Upper  respiratory tract infection with back pain  EXAM: CHEST  2 VIEW  COMPARISON:  11/04/2008  FINDINGS: Cardiac shadow remains enlarged but stable. Mild vascular congestion is seen with interstitial edema consistent with mild CHF. No focal infiltrate or sizable effusion is noted.  IMPRESSION: Mild CHF.   Electronically Signed   By: Inez Catalina M.D.   On: 06/09/2014 13:09   Ct Angio Chest Pe W/cm &/or Wo Cm  06/10/2014   CLINICAL DATA:  Chest pain, shortness of breath, hypoxia. Elevated troponin.  EXAM: CT ANGIOGRAPHY CHEST WITH CONTRAST  TECHNIQUE: Multidetector CT imaging of the chest was performed using the standard protocol during bolus administration of intravenous contrast. Multiplanar CT image reconstructions and MIPs were obtained to evaluate the vascular anatomy.  CONTRAST:  13mL OMNIPAQUE IOHEXOL 350 MG/ML SOLN  COMPARISON:  07/20/2008  FINDINGS: Technically adequate study with good opacification of the central and segmental pulmonary arteries. No focal filling defects. No evidence of significant pulmonary embolus.  Diffuse cardiac enlargement with left ventricular hypertrophy. Enlarged main pulmonary artery suggesting arterial hypertension. Normal caliber thoracic aorta without evidence of dissection. Esophagus is decompressed. No significant lymphadenopathy in the chest. Reflux of contrast material into the inferior vena cava and hepatic veins suggest passive congestion due to heart failure.  Mosaic attenuation pattern in the lungs may represent edema. No focal consolidation. Airways appear patent. No pleural effusions. No pneumothorax.  Included portions of the upper abdominal organs are grossly unremarkable. No destructive bone lesions.  Review of the MIP images confirms the above findings.  IMPRESSION: No evidence of significant pulmonary embolus. Cardiac enlargement with left ventricular hypertrophy. Probable pulmonary arterial hypertension. Mosaic attenuation in the lungs may indicate edema.    Electronically Signed   By: Lucienne Capers M.D.   On: 06/10/2014 01:02    Microbiology: Recent Results (from the past 240 hour(s))  Respiratory virus panel (routine influenza)     Status: None   Collection Time: 06/09/14  7:08 PM  Result Value Ref Range Status   Source - [facility]AN NASAL WASHINGS  Corrected   Respiratory Syncytial Virus A Negative Negative Final   Respiratory Syncytial Virus B Negative Negative Final   Influenza A Negative  Negative Final   Influenza B Negative Negative Final   Parainfluenza 1 Negative Negative Final   Parainfluenza 2 Negative Negative Final   Parainfluenza 3 Negative Negative Final   Metapneumovirus Negative Negative Final   Rhinovirus Negative Negative Final   Adenovirus Negative Negative Final    Comment: (NOTE) Performed At: [facility] [address] [place], [place] [id number] Lindon Romp MD IE:[id number]      Labs: Basic Metabolic Panel:  Recent Labs Lab 06/09/14 1329 06/10/14 0444 06/11/14 0525 06/12/14 0440  NA 139 137 132* 136  K 3.7 4.0 4.3 4.1  CL 104 102 101 107  CO2 25 27 27 26   GLUCOSE 101* 87 95 97  BUN 18 21 23 22   CREATININE 1.14* 1.32* 1.37* 1.42*  CALCIUM 9.1 8.3* 8.2* 8.3*   Liver Function Tests:  Recent Labs Lab 06/09/14 1329 06/10/14 0444  AST 24 18  ALT 23 19  ALKPHOS 56 48  BILITOT 0.4 0.4  PROT 7.0 6.2  ALBUMIN 3.8 3.3*   No results for input(s): LIPASE, AMYLASE in the last 168 hours. No results for input(s): AMMONIA in the last 168 hours. CBC:  Recent Labs Lab 06/09/14 1329 06/10/14 0444 06/11/14 0525  WBC 11.4* 10.8* 8.1  NEUTROABS 8.7*  --   --   HGB 9.5* 8.9* 9.0*  HCT 31.0* 28.4* 29.8*  MCV 82.7 83.0 83.7  PLT 268 244 277   Cardiac Enzymes:  Recent Labs Lab 06/09/14 1708 06/09/14 2157 06/10/14 0444  TROPONINI 0.27* 0.25* 0.21*   BNP: BNP (last 3 results)  Recent Labs  06/09/14 1329 06/09/14 1707  BNP 247.6* 272.9*    ProBNP (last 3 results) No results  for input(s): PROBNP in the last 8760 hours.  CBG:  Recent Labs Lab 06/11/14 0752 06/12/14 0745  GLUCAP 101* 133*     Additional labs:  Urine pregnancy test: Negative  TSH: 3.019  UDS: Negative  2-D echo 06/10/14: Study Conclusions  - Left ventricle: The cavity size was normal. Wall thickness was increased in a pattern of severe LVH. Systolic function was severely reduced. The estimated ejection fraction was in the range of 20% to 25%. Severe hypokinesis of the entire myocardium. - Mitral valve: There was mild regurgitation. - Left atrium: The atrium was severely dilated.  Signed:  Vernell Leep, MD, FACP, FHM. [facility] Pager [phone]  If 7PM-7AM, please contact night-coverage [email or web address] Password [email or web address] 06/12/2014, 1:46 PM

## 2014-06-18 ENCOUNTER — Other Ambulatory Visit (HOSPITAL_COMMUNITY): Payer: Self-pay

## 2014-06-18 ENCOUNTER — Telehealth: Payer: Self-pay | Admitting: Internal Medicine

## 2014-06-18 NOTE — Telephone Encounter (Signed)
Ok appears Dr Burt Knack saw pt and ordered monitor and referred her here, will address at [facility] next week, will send to Dr Burt Knack so he is aware

## 2014-06-18 NOTE — Telephone Encounter (Signed)
Had called patient x 2 to set up 30 day event monitor per D/C instruction from [facility] with patient today to schedule and she refused.  She stated that she didn't know about this.   Patient is also self pay and I told her she would need to fill out application for [employer].  She refused again.  I told her I will let the Clinic know and that they would discuss it with her at the next appointment.

## 2014-06-19 ENCOUNTER — Inpatient Hospital Stay (HOSPITAL_COMMUNITY): Admission: RE | Admit: 2014-06-19 | Payer: Self-pay | Source: Ambulatory Visit

## 2014-06-25 ENCOUNTER — Ambulatory Visit (HOSPITAL_COMMUNITY)
Admission: RE | Admit: 2014-06-25 | Discharge: 2014-06-25 | Disposition: A | Discharge status: Home / Self Care | Payer: Self-pay | Source: Ambulatory Visit | Attending: Internal Medicine | Admitting: Internal Medicine

## 2014-06-25 ENCOUNTER — Encounter (HOSPITAL_COMMUNITY): Payer: Self-pay

## 2014-06-25 VITALS — BP 162/110 | HR 110 | Wt 222.0 lb

## 2014-06-25 DIAGNOSIS — G4733 Obstructive sleep apnea (adult) (pediatric): Secondary | ICD-10-CM | POA: Insufficient documentation

## 2014-06-25 DIAGNOSIS — Z9119 Patient's noncompliance with other medical treatment and regimen: Secondary | ICD-10-CM | POA: Insufficient documentation

## 2014-06-25 DIAGNOSIS — Z8673 Personal history of transient ischemic attack (TIA), and cerebral infarction without residual deficits: Secondary | ICD-10-CM | POA: Insufficient documentation

## 2014-06-25 DIAGNOSIS — I5022 Chronic systolic (congestive) heart failure: Secondary | ICD-10-CM | POA: Insufficient documentation

## 2014-06-25 DIAGNOSIS — I5021 Acute systolic (congestive) heart failure: Secondary | ICD-10-CM

## 2014-06-25 DIAGNOSIS — E119 Type 2 diabetes mellitus without complications: Secondary | ICD-10-CM | POA: Insufficient documentation

## 2014-06-25 DIAGNOSIS — Z79899 Other long term (current) drug therapy: Secondary | ICD-10-CM | POA: Insufficient documentation

## 2014-06-25 DIAGNOSIS — Z9114 Patient's other noncompliance with medication regimen: Secondary | ICD-10-CM | POA: Insufficient documentation

## 2014-06-25 DIAGNOSIS — I10 Essential (primary) hypertension: Secondary | ICD-10-CM | POA: Insufficient documentation

## 2014-06-25 DIAGNOSIS — F17210 Nicotine dependence, cigarettes, uncomplicated: Secondary | ICD-10-CM | POA: Insufficient documentation

## 2014-06-25 DIAGNOSIS — F17200 Nicotine dependence, unspecified, uncomplicated: Secondary | ICD-10-CM

## 2014-06-25 DIAGNOSIS — N183 Chronic kidney disease, stage 3 unspecified: Secondary | ICD-10-CM

## 2014-06-25 DIAGNOSIS — Z720 Tobacco use: Secondary | ICD-10-CM

## 2014-06-25 LAB — BASIC METABOLIC PANEL
ANION GAP: 10 (ref 5–15)
BUN: 21 mg/dL (ref 6–23)
CALCIUM: 9.1 mg/dL (ref 8.4–10.5)
CHLORIDE: 102 mmol/L (ref 96–112)
CO2: 25 mmol/L (ref 19–32)
Creatinine, Ser: 1.29 mg/dL — ABNORMAL HIGH (ref 0.50–1.10)
GFR, EST AFRICAN AMERICAN: 57 mL/min — AB (ref >90)
GFR, EST NON AFRICAN AMERICAN: 50 mL/min — AB (ref >90)
Glucose, Bld: 80 mg/dL (ref 70–99)
Potassium: 4.4 mmol/L (ref 3.5–5.1)
SODIUM: 137 mmol/L (ref 135–145)

## 2014-06-25 MED ORDER — CARVEDILOL 12.5 MG PO TABS: 12.5000 mg | ORAL_TABLET | Freq: Two times a day (BID) | ORAL | Status: DC

## 2014-06-25 MED ORDER — POTASSIUM CHLORIDE CRYS ER 20 MEQ PO TBCR: 20.0000 meq | EXTENDED_RELEASE_TABLET | Freq: Every day | ORAL | Status: DC

## 2014-06-25 NOTE — Progress Notes (Signed)
Patient ID: [name], female   DOB: 09-Sep-1970, 44 y.o.   MRN: [id number] PCP: Primary Cardiologist:  HPI: [name] is a 44 yo female with history of chronic systolic HF due to NICM dating back to at least 2009, HTN, DM, CVA 2010, tobacco abuse Had cath 2011 at [facility] with normal cors.    Admitted to [facility] with respiratory distress due to bronchitis in 2/16. She had been off HF meds as well due to noncompliance. These were resumed. Discharge weight was 227 pounds.   Today she returns for post hospital follow up. She is unable to pay for medications. So she has not had carvedilol for 2 weeks. Mild dyspnea with exertion. Smoking cigarettes. Currently not working.  Says she was previously unable to tolerate Bidil or Imdur. Does not have good insight into her meds. Not weighing every day.   Labs 06/12/2014 K 4.1 Creatinine 1.42   ECHO 06/10/2014 EF 20-25% Dilated LA. RV ok.   ROS: All systems negative except as listed in HPI, PMH and Problem List.  SH:  History   Social History  . Marital Status: Single    Spouse Name: N/A  . Number of Children: N/A  . Years of Education: N/A   Occupational History  . Not on file.   Social History Main Topics  . Smoking status: Current Every Day Smoker -- 0.30 packs/day for 12 years    Types: Cigarettes  . Smokeless tobacco: Not on file  . Alcohol Use: Yes     Comment: rarely  . Drug Use: Not on file  . Sexual Activity: Not on file   Other Topics Concern  . Not on file   Social History Narrative    FH:  Family History  Problem Relation Age of Onset  . CVA Mother     Past Medical History  Diagnosis Date  . Chronic systolic CHF   . Stroke     2010  . HTN (hypertension)   . Tobacco abuse   . Non-ischemic cardiomyopathy     Current Outpatient Prescriptions  Medication Sig Dispense Refill  . acetaminophen (TYLENOL) 325 MG tablet Take 2 tablets (650 mg total) by mouth every 6 (six) hours as needed for mild pain, moderate pain, fever  or headache (or Fever >/= 101).    . carvedilol (COREG) 25 MG tablet Take 1 tablet (25 mg total) by mouth 2 (two) times daily with a meal. 60 tablet 0  . fluticasone (FLONASE) 50 MCG/ACT nasal spray Place 2 sprays into both nostrils 2 (two) times daily as needed for allergies or rhinitis.    . furosemide (LASIX) 40 MG tablet Take 1 tablet (40 mg total) by mouth 2 (two) times daily. 60 tablet 0  . guaiFENesin (ROBITUSSIN) 100 MG/5ML SOLN Take 10 mLs (200 mg total) by mouth every 6 (six) hours as needed for cough or to loosen phlegm. 236 mL 0  . hydrALAZINE (APRESOLINE) 50 MG tablet Take 50 mg by mouth 3 (three) times daily.    . pravastatin (PRAVACHOL) 40 MG tablet Take 40 mg by mouth daily.    Marland Kitchen spironolactone (ALDACTONE) 25 MG tablet Take 25 mg by mouth daily.     No current facility-administered medications for this encounter.    Filed Vitals:   06/25/14 1535  BP: 162/110  Pulse: 110  Weight: 222 lb (100.699 kg)  SpO2: 98%    PHYSICAL EXAM:  General:  Well appearing. No resp difficulty HEENT: normal Neck: supple. Thick. JVP flat. Carotids  2+ bilaterally; no bruits. No lymphadenopathy or thryomegaly appreciated. Cor: PMI normal.Tachy  Regular rate & rhythm. +s3 Lungs: clear Abdomen: obese, soft, nontender, nondistended. No hepatosplenomegaly. No bruits or masses. Good bowel sounds. Extremities: no cyanosis, clubbing, rash, edema Neuro: alert & orientedx3, cranial nerves grossly intact. Moves all 4 extremities w/o difficulty. Affect pleasant.   ASSESSMENT & PLAN:  1. Chronic Systolic Heart Failure- ECHO EF 20-25% (2/16). Due to NICM - suspect hypertensive. Corns normal on cath 2012 in [place]  - NYHA II-III volume status currently stable  - she seems to be quite noncompliant with meds  - will restart carvedilol 12.5 mg twice a day  - continue lisinopril 20 mg daily  - start kcl   - continue hydralazine and spiro. Unable to tolerate Imdur or bidil   - stressed need for med  compliance. meds provided through HF fund  - would likely benefit from digoxin and/or ivabradine in near future  - will need ICD referral once compliant with meds  - labs ordered 2. HTN   - BP up. Titrating meds 3. Tobacco use  - counseled on need to quit  4. OSA  - noncompliant with CPAP  5. Morbid obesity  - counseled on need for weight loss 6. Noncompliance  - major issue here  Total time spent 45 minutes. Over half that time spent discussing above.   Daniel Bensimhon,MD 1:46 AM

## 2014-06-25 NOTE — Patient Instructions (Signed)
START Lisinopril 20mg  tablet once daily.  START Carvedilol (Coreg) 12.5mg  tablet twice daily.  START Potassium 20 meq tablet once daily.  Follow up 2 weeks.  Do the following things EVERYDAY: 1) Weigh yourself in the morning before breakfast. Write it down and keep it in a log. 2) Take your medicines as prescribed 3) Eat low salt foods-Limit salt (sodium) to 2000 mg per day.  4) Stay as active as you can everyday 5) Limit all fluids for the day to less than 2 liters

## 2014-06-26 DIAGNOSIS — I5022 Chronic systolic (congestive) heart failure: Secondary | ICD-10-CM | POA: Insufficient documentation

## 2014-06-30 ENCOUNTER — Telehealth: Payer: Self-pay | Admitting: Licensed Clinical Social Worker

## 2014-06-30 NOTE — Telephone Encounter (Signed)
CSW received referral to assist with transport needs. CSW contacted patient and will meet prior to her appointment in the clinic on 07/08/14 at 11:30am. Raquel Sarna, [place]

## 2014-07-08 ENCOUNTER — Encounter (HOSPITAL_COMMUNITY): Payer: Self-pay

## 2014-07-08 ENCOUNTER — Ambulatory Visit (HOSPITAL_COMMUNITY)
Admission: RE | Admit: 2014-07-08 | Discharge: 2014-07-08 | Disposition: A | Discharge status: Home / Self Care | Payer: Self-pay | Source: Ambulatory Visit | Attending: Adult Health | Admitting: Adult Health

## 2014-07-08 ENCOUNTER — Encounter: Payer: Self-pay | Admitting: Licensed Clinical Social Worker

## 2014-07-08 VITALS — BP 112/92 | HR 77 | Wt 218.4 lb

## 2014-07-08 DIAGNOSIS — Z720 Tobacco use: Secondary | ICD-10-CM

## 2014-07-08 DIAGNOSIS — G4733 Obstructive sleep apnea (adult) (pediatric): Secondary | ICD-10-CM | POA: Insufficient documentation

## 2014-07-08 DIAGNOSIS — Z9114 Patient's other noncompliance with medication regimen: Secondary | ICD-10-CM | POA: Insufficient documentation

## 2014-07-08 DIAGNOSIS — F17210 Nicotine dependence, cigarettes, uncomplicated: Secondary | ICD-10-CM | POA: Insufficient documentation

## 2014-07-08 DIAGNOSIS — Z91199 Patient's noncompliance with other medical treatment and regimen due to unspecified reason: Secondary | ICD-10-CM | POA: Insufficient documentation

## 2014-07-08 DIAGNOSIS — I10 Essential (primary) hypertension: Secondary | ICD-10-CM | POA: Insufficient documentation

## 2014-07-08 DIAGNOSIS — Z79899 Other long term (current) drug therapy: Secondary | ICD-10-CM | POA: Insufficient documentation

## 2014-07-08 DIAGNOSIS — Z8673 Personal history of transient ischemic attack (TIA), and cerebral infarction without residual deficits: Secondary | ICD-10-CM | POA: Insufficient documentation

## 2014-07-08 DIAGNOSIS — Z9119 Patient's noncompliance with other medical treatment and regimen: Secondary | ICD-10-CM | POA: Insufficient documentation

## 2014-07-08 DIAGNOSIS — F17200 Nicotine dependence, unspecified, uncomplicated: Secondary | ICD-10-CM

## 2014-07-08 DIAGNOSIS — I5022 Chronic systolic (congestive) heart failure: Secondary | ICD-10-CM | POA: Insufficient documentation

## 2014-07-08 DIAGNOSIS — I429 Cardiomyopathy, unspecified: Secondary | ICD-10-CM | POA: Insufficient documentation

## 2014-07-08 MED ORDER — SPIRONOLACTONE 25 MG PO TABS: 25.0000 mg | ORAL_TABLET | Freq: Every day | ORAL | Status: DC

## 2014-07-08 NOTE — Progress Notes (Signed)
CSW received referral to assist patient with transportation. Patient reports she has no insurance and has not worked since 04/13/14. She plans to apply for unemployment and states that she is not "disabled enough" to apply for disability. CSW discussed options for healthcare and transport. Patient plans to follow up with an [facility] card application to assist with medical needs. Patient does not appear to qualify for [employer] transportation as disability needed. Patient states she is able to walk to bus stop close to home without incident. CSW provided support and information on the orange card. CSW will be available if further needs arise. Raquel Sarna, [place]

## 2014-07-08 NOTE — Patient Instructions (Addendum)
RESTART Spironolactone 25 mg, one tab daily  Your physician recommends that you schedule a follow-up appointment in: 2 weeks with labs  You have been referred to [facility], they will contact you to arrange services   Do the following things EVERYDAY: 1) Weigh yourself in the morning before breakfast. Write it down and keep it in a log. 2) Take your medicines as prescribed 3) Eat low salt foods-Limit salt (sodium) to 2000 mg per day.  4) Stay as active as you can everyday 5) Limit all fluids for the day to less than 2 liters 6)

## 2014-07-08 NOTE — Progress Notes (Signed)
Patient ID: [name], female   DOB: 1970-10-07, 44 y.o.   MRN: [id number] PCP: None  Primary Cardiologist: Dr Haroldine Laws HPI: [name] is a 44 yo female with history of chronic systolic HF due to NICM dating back to at least 2009, HTN, DM, CVA 2010, tobacco abuse Had cath 2011 at [facility] with normal cors.    Admitted to [facility] with respiratory distress due to bronchitis in 2/16. She had been off HF meds as well due to noncompliance. These were resumed. Discharge weight was 227 pounds.   Today she returns for follow up. Last visit carvedilol was restarted. Mild dyspnea when she bends over. Mild dyspnea after walking 15 minutes. Weight at home 224 pounds. Has not smoked in a week. Trying to get orange card. Taking all medications except spironolactone and she is not sure why.   Labs 06/12/2014 K 4.1 Creatinine 1.42  Labs 06/25/2014: 4.4 Creatinine 1.29   ECHO 06/10/2014 EF 20-25% Dilated LA. RV ok.   ROS: All systems negative except as listed in HPI, PMH and Problem List.  SH:  History   Social History  . Marital Status: Single    Spouse Name: N/A  . Number of Children: N/A  . Years of Education: N/A   Occupational History  . Not on file.   Social History Main Topics  . Smoking status: Light Tobacco Smoker -- 0.30 packs/day for 12 years    Types: Cigarettes  . Smokeless tobacco: Former Systems developer    Quit date: 06/24/2014  . Alcohol Use: 0.0 oz/week    0 Standard drinks or equivalent per week     Comment: rarely  . Drug Use: Not on file  . Sexual Activity: Not on file   Other Topics Concern  . Not on file   Social History Narrative    FH:  Family History  Problem Relation Age of Onset  . CVA Mother     Past Medical History  Diagnosis Date  . Chronic systolic CHF   . Stroke     2010  . HTN (hypertension)   . Tobacco abuse   . Non-ischemic cardiomyopathy     Current Outpatient Prescriptions  Medication Sig Dispense Refill  . acetaminophen (TYLENOL) 325 MG tablet Take 2  tablets (650 mg total) by mouth every 6 (six) hours as needed for mild pain, moderate pain, fever or headache (or Fever >/= 101).    . carvedilol (COREG) 12.5 MG tablet Take 1 tablet (12.5 mg total) by mouth 2 (two) times daily. 180 tablet 3  . fluticasone (FLONASE) 50 MCG/ACT nasal spray Place 2 sprays into both nostrils 2 (two) times daily as needed for allergies or rhinitis.    . furosemide (LASIX) 40 MG tablet Take 1 tablet (40 mg total) by mouth 2 (two) times daily. 60 tablet 0  . hydrALAZINE (APRESOLINE) 50 MG tablet Take 50 mg by mouth 3 (three) times daily.    Marland Kitchen lisinopril (PRINIVIL,ZESTRIL) 20 MG tablet Take 20 mg by mouth daily.    . pravastatin (PRAVACHOL) 40 MG tablet Take 40 mg by mouth daily.     No current facility-administered medications for this encounter.    Filed Vitals:   07/08/14 1145  BP: 112/92  Pulse: 77  Weight: 218 lb 6.4 oz (99.066 kg)  SpO2: 97%    PHYSICAL EXAM:  General:  Well appearing. No resp difficulty HEENT: normal Neck: supple. Thick. JVP 5-6. Carotids 2+ bilaterally; no bruits. No lymphadenopathy or thryomegaly appreciated. Cor: PMI normal.Tachy  Regular rate & rhythm. +s3 Lungs: clear Abdomen: obese, soft, nontender, nondistended. No hepatosplenomegaly. No bruits or masses. Good bowel sounds. Extremities: no cyanosis, clubbing, rash, edema Neuro: alert & orientedx3, cranial nerves grossly intact. Moves all 4 extremities w/o difficulty. Affect pleasant.   ASSESSMENT & PLAN:  1. Chronic Systolic Heart Failure- ECHO EF 20-25% (2/16). Due to NICM - suspect hypertensive. Cors normal on cath 2012 in [place]  - NYHA II-III volume status currently stable. Continue lasix 40 mg twice a day.   - Continue  carvedilol 12.5 mg twice a day  - Continue lisinopril 20 mg daily - Restart 25 mg spironolactone today.    - Continue hydralazine 50 mg three times a day. Unable to tolerate Imdur or bidil due to headaches.    -  - would likely benefit from digoxin  and/or ivabradine in near future  - will need ICD referral once compliant with meds 2. HTN   - Improved. Continue current regimen.  3. Tobacco use  - quit smoking 1 week ago. Encouraged to stay off cigarettes.   4. OSA- not using CPAP  5. Morbid obesity  - counseled on need for weight loss 6. Noncompliance  - Refer to paramedicine to get a handle on medications.   Follow up in 2 weeks and will check BMET  Carollynn Pennywell NP-C  11:57 AM

## 2014-07-22 ENCOUNTER — Encounter (HOSPITAL_COMMUNITY): Payer: Self-pay

## 2014-08-07 ENCOUNTER — Ambulatory Visit (HOSPITAL_COMMUNITY)
Admission: RE | Admit: 2014-08-07 | Discharge: 2014-08-07 | Disposition: A | Discharge status: Home / Self Care | Payer: Self-pay | Source: Ambulatory Visit | Attending: Internal Medicine | Admitting: Internal Medicine

## 2014-08-07 ENCOUNTER — Ambulatory Visit (HOSPITAL_COMMUNITY): Payer: Self-pay | Attending: Internal Medicine

## 2014-08-07 ENCOUNTER — Encounter (HOSPITAL_COMMUNITY): Payer: Self-pay

## 2014-08-07 VITALS — BP 167/108 | HR 116 | Resp 20 | Wt 221.2 lb

## 2014-08-07 DIAGNOSIS — Z79899 Other long term (current) drug therapy: Secondary | ICD-10-CM | POA: Insufficient documentation

## 2014-08-07 DIAGNOSIS — I5022 Chronic systolic (congestive) heart failure: Secondary | ICD-10-CM | POA: Insufficient documentation

## 2014-08-07 DIAGNOSIS — I10 Essential (primary) hypertension: Secondary | ICD-10-CM | POA: Insufficient documentation

## 2014-08-07 DIAGNOSIS — Z720 Tobacco use: Secondary | ICD-10-CM

## 2014-08-07 DIAGNOSIS — F17200 Nicotine dependence, unspecified, uncomplicated: Secondary | ICD-10-CM

## 2014-08-07 DIAGNOSIS — Z9119 Patient's noncompliance with other medical treatment and regimen: Secondary | ICD-10-CM | POA: Insufficient documentation

## 2014-08-07 DIAGNOSIS — R51 Headache: Secondary | ICD-10-CM | POA: Insufficient documentation

## 2014-08-07 DIAGNOSIS — G4733 Obstructive sleep apnea (adult) (pediatric): Secondary | ICD-10-CM | POA: Insufficient documentation

## 2014-08-07 DIAGNOSIS — I429 Cardiomyopathy, unspecified: Secondary | ICD-10-CM | POA: Insufficient documentation

## 2014-08-07 DIAGNOSIS — F17210 Nicotine dependence, cigarettes, uncomplicated: Secondary | ICD-10-CM | POA: Insufficient documentation

## 2014-08-07 NOTE — Patient Instructions (Signed)
Resume Lisinopril, Spironolactone, and Carvedilol as currently prescribed.  Follow up 2-3 weeks.  Do the following things EVERYDAY: 1) Weigh yourself in the morning before breakfast. Write it down and keep it in a log. 2) Take your medicines as prescribed 3) Eat low salt foods-Limit salt (sodium) to 2000 mg per day.  4) Stay as active as you can everyday 5) Limit all fluids for the day to less than 2 liters

## 2014-08-08 ENCOUNTER — Ambulatory Visit (HOSPITAL_COMMUNITY): Payer: Self-pay

## 2014-08-09 NOTE — Progress Notes (Signed)
Patient ID: [name], female   DOB: 13-Sep-1970, 44 y.o.   MRN: [id number] PCP: None  Primary Cardiologist: Dr Haroldine Laws  HPI: [name] is a 44 yo female with history of chronic systolic HF due to nonischemic cardiomyopathy dating back to at least 2009, HTN, DM, CVA 2010, tobacco abuse, Had cath 2011 at [facility] with normal coronaries.    Admitted to [facility] with respiratory distress due to bronchitis in 2/16. She had been off HF meds as well due to noncompliance. These were resumed. Discharge weight was 227 pounds.   She returns for followup today.  She has been out of Coreg, lisinopril, and spironolactone for 2-3 days.  She feels bad today and has a headache.  Weight is up 3 lbs.  BP is 167/108.  No chest pain.  No exertional dyspnea, orthopnea, PND.  She smokes several cigarettes a day.   ECG: sinus tachy at 108, biatrial enlargement, LVH  Labs 06/12/2014 K 4.1 Creatinine 1.42  Labs 06/25/2014: 4.4 Creatinine 1.29   ECHO 06/10/2014: EF 20-25%, severe LVH, severe LAE. RV ok.   ROS: All systems negative except as listed in HPI, PMH and Problem List.  SH:  History   Social History  . Marital Status: Single    Spouse Name: N/A  . Number of Children: N/A  . Years of Education: N/A   Occupational History  . Not on file.   Social History Main Topics  . Smoking status: Light Tobacco Smoker -- 0.30 packs/day for 12 years    Types: Cigarettes  . Smokeless tobacco: Former Systems developer    Quit date: 06/24/2014  . Alcohol Use: 0.0 oz/week    0 Standard drinks or equivalent per week     Comment: rarely  . Drug Use: Not on file  . Sexual Activity: Not on file   Other Topics Concern  . Not on file   Social History Narrative    FH:  Family History  Problem Relation Age of Onset  . CVA Mother     Past Medical History  Diagnosis Date  . Chronic systolic CHF   . Stroke     2010  . HTN (hypertension)   . Tobacco abuse   . Non-ischemic cardiomyopathy     Current Outpatient Prescriptions   Medication Sig Dispense Refill  . acetaminophen (TYLENOL) 325 MG tablet Take 2 tablets (650 mg total) by mouth every 6 (six) hours as needed for mild pain, moderate pain, fever or headache (or Fever >/= 101).    . fluticasone (FLONASE) 50 MCG/ACT nasal spray Place 2 sprays into both nostrils 2 (two) times daily as needed for allergies or rhinitis.    . furosemide (LASIX) 40 MG tablet Take 1 tablet (40 mg total) by mouth 2 (two) times daily. 60 tablet 0  . hydrALAZINE (APRESOLINE) 50 MG tablet Take 50 mg by mouth 3 (three) times daily.    . pravastatin (PRAVACHOL) 40 MG tablet Take 40 mg by mouth daily.    . carvedilol (COREG) 12.5 MG tablet Take 1 tablet (12.5 mg total) by mouth 2 (two) times daily. (Patient not taking: Reported on 08/07/2014) 180 tablet 3  . lisinopril (PRINIVIL,ZESTRIL) 20 MG tablet Take 20 mg by mouth daily.    Marland Kitchen spironolactone (ALDACTONE) 25 MG tablet Take 1 tablet (25 mg total) by mouth daily. (Patient not taking: Reported on 08/07/2014) 30 tablet 3   No current facility-administered medications for this encounter.    Filed Vitals:   08/07/14 1349  BP: 167/108  Pulse: 116  Resp: 20  Weight: 221 lb 4 oz (100.358 kg)  SpO2: 98%    PHYSICAL EXAM:  General:  Well appearing. No resp difficulty HEENT: normal Neck: supple. Thick. JVP 8. Carotids 2+ bilaterally; no bruits. No lymphadenopathy or thryomegaly appreciated. Cor: PMI normal.Tachy  Regular rate & rhythm. +s4 Lungs: clear Abdomen: obese, soft, nontender, nondistended. No hepatosplenomegaly. No bruits or masses. Good bowel sounds. Extremities: no cyanosis, clubbing, rash, edema Neuro: alert & orientedx3, cranial nerves grossly intact. Moves all 4 extremities w/o difficulty. Affect pleasant.   ASSESSMENT & PLAN:  1. Chronic Systolic Heart Failure: ECHO EF 20-25% with severe LVH (2/16). Nonischemic cardiomyopathy - suspect hypertensive cardiomyopathy. Coronaries normal on cath 2012 in [place].  NYHA class II  symptoms, feels bad today (headache) in setting of elevated BP off several of her meds.  - Volume looks ok. Continue lasix 40 mg twice a day.  - Restart carvedilol 12.5 mg twice a day - Restart lisinopril 20 mg daily - Restart 25 mg spironolactone today.   - Continue hydralazine 50 mg three times a day. Unable to tolerate Imdur or bidil due to headaches.   - Repeat echo when BP controlled to see if EF improves.  If not, will need ICD (narrow QRS, no CRT).  2. HTN: BP high but off meds.  Restart the above meds, we gave her samples from the office.  3. Tobacco use: I again encouraged her to quit.   4. OSA: not using CPAP  Followup in 2-3 weeks on meds   [name] 08/09/2014

## 2014-08-18 ENCOUNTER — Telehealth (HOSPITAL_COMMUNITY): Payer: Self-pay | Admitting: Surgery

## 2014-08-18 NOTE — Telephone Encounter (Signed)
I received a phone call from Bradd Canary with [employer].  He is visiting her in her home and was verifying medications that she is currently supposed to be taking.  I checked last documented med list and confirmed with him medications.  She is currently out of Hydralazine completely and BP is elevated 190/118.  [name] tells Nicki Reaper that she has no money for any medications.  I will have clinic staff send in a prescription for Hydralazine to [facility] and we will provide Hydralazine for her.  Nicki Reaper will get medication and deliver to her.

## 2014-08-27 ENCOUNTER — Encounter (HOSPITAL_COMMUNITY): Payer: Self-pay

## 2014-09-10 ENCOUNTER — Encounter (HOSPITAL_COMMUNITY): Payer: Self-pay

## 2014-09-15 ENCOUNTER — Telehealth (HOSPITAL_COMMUNITY): Payer: Self-pay | Admitting: Vascular Surgery

## 2014-09-15 ENCOUNTER — Telehealth (HOSPITAL_COMMUNITY): Payer: Self-pay | Admitting: *Deleted

## 2014-09-15 DIAGNOSIS — I5022 Chronic systolic (congestive) heart failure: Secondary | ICD-10-CM

## 2014-09-15 MED ORDER — ATORVASTATIN CALCIUM 20 MG PO TABS: 20.0000 mg | ORAL_TABLET | Freq: Every day | ORAL | Status: DC

## 2014-09-15 MED ORDER — HYDRALAZINE HCL 50 MG PO TABS: 50.0000 mg | ORAL_TABLET | Freq: Three times a day (TID) | ORAL | Status: DC

## 2014-09-15 NOTE — Telephone Encounter (Signed)
Pt has no more refills on  hydrazline

## 2014-09-15 NOTE — Telephone Encounter (Signed)
[name] with [facility] called, she is at pt's home now and wanted to clarify some medications.  She wants to know if pt is suppose to be taking Potassium or not, she states pt has not had it in a while but received a call that it was ready to be picked up at the pharmacy.  She also states pt is out of Lisinopril and Pravastatin and doesn't have money to get these meds refilled.  Upon further discussion with [name], pt has no insurance and transportation and money for meds is a major concern for pt.  Some of pt's prescriptions come from [facility] while others come from [facility].  Explained to [name] we should make it as easy as possible for pt to help her be more compliant, have sent in refill for all heart medications to the [facility] for the HF found, as we do not use pravastatin on that program we have changed it to Atorva 20 per Darrick Grinder, NP.  [name] is aware K-dur has been stopped as pt was restarted on Arlyce Harman a while back, she will advise pt's that rx have been sent in and she will actually pick them up for pt today and take them to her so she will have no excuse for not taking them, she also will advise pt she has to keep her upcoming appt if she wants to continue to get meds through our [facility]st[facility] Program.

## 2014-10-02 ENCOUNTER — Ambulatory Visit (HOSPITAL_COMMUNITY)
Admission: RE | Admit: 2014-10-02 | Discharge: 2014-10-02 | Disposition: A | Discharge status: Home / Self Care | Payer: Self-pay | Source: Ambulatory Visit | Attending: Cardiology | Admitting: Cardiology

## 2014-10-02 VITALS — BP 116/64 | HR 70 | Wt 220.2 lb

## 2014-10-02 DIAGNOSIS — F17210 Nicotine dependence, cigarettes, uncomplicated: Secondary | ICD-10-CM | POA: Insufficient documentation

## 2014-10-02 DIAGNOSIS — I10 Essential (primary) hypertension: Secondary | ICD-10-CM

## 2014-10-02 DIAGNOSIS — Z720 Tobacco use: Secondary | ICD-10-CM

## 2014-10-02 DIAGNOSIS — E119 Type 2 diabetes mellitus without complications: Secondary | ICD-10-CM | POA: Insufficient documentation

## 2014-10-02 DIAGNOSIS — Z79899 Other long term (current) drug therapy: Secondary | ICD-10-CM | POA: Insufficient documentation

## 2014-10-02 DIAGNOSIS — I429 Cardiomyopathy, unspecified: Secondary | ICD-10-CM | POA: Insufficient documentation

## 2014-10-02 DIAGNOSIS — F17200 Nicotine dependence, unspecified, uncomplicated: Secondary | ICD-10-CM

## 2014-10-02 DIAGNOSIS — Z8673 Personal history of transient ischemic attack (TIA), and cerebral infarction without residual deficits: Secondary | ICD-10-CM | POA: Insufficient documentation

## 2014-10-02 DIAGNOSIS — I5022 Chronic systolic (congestive) heart failure: Secondary | ICD-10-CM

## 2014-10-02 DIAGNOSIS — G4733 Obstructive sleep apnea (adult) (pediatric): Secondary | ICD-10-CM | POA: Insufficient documentation

## 2014-10-02 MED ORDER — SACUBITRIL-VALSARTAN 49-51 MG PO TABS: 1.0000 | ORAL_TABLET | Freq: Two times a day (BID) | ORAL | Status: DC

## 2014-10-02 NOTE — Patient Instructions (Signed)
Stop Lisinopril  Start Entresto 49/51 mg Twice daily, take first dose Sunday AM  Continue Carvedilol 25 mg Twice daily   Labs today  Labs in 2 weeks  Your physician has requested that you have an echocardiogram. Echocardiography is a painless test that uses sound waves to create images of your heart. It provides your doctor with information about the size and shape of your heart and how well your heart's chambers and valves are working. This procedure takes approximately one hour. There are no restrictions for this procedure.  IN 1 MONTH  Your physician recommends that you schedule a follow-up appointment in: 1 month

## 2014-10-04 NOTE — Progress Notes (Signed)
Patient ID: [name], female   DOB: Oct 20, 1970, 44 y.o.   MRN: [id number] PCP: None  Primary Cardiologist: Dr Haroldine Laws  HPI: [name] is a 44 yo female with history of chronic systolic HF due to nonischemic cardiomyopathy dating back to at least 2009, HTN, DM, CVA 2010, tobacco abuse, Had cath 2011 at [facility] with normal coronaries.    Admitted to [facility] with respiratory distress due to bronchitis in 2/16. She had been off HF meds as well due to noncompliance. These were resumed. Discharge weight was 227 pounds.   At last appointment, she was not taking her medications.  Today, she is taking medications as ordered except taking lisinopril 40 mg bid rather than daily.  BP today is well-controlled.  She denies dyspnea or chest pain.  She is doing custodial work.   Labs 06/12/2014 K 4.1 Creatinine 1.42  Labs 06/25/2014: 4.4 Creatinine 1.29   ECHO 06/10/2014: EF 20-25%, severe LVH, severe LAE. RV ok.   ROS: All systems negative except as listed in HPI, PMH and Problem List.  SH:  History   Social History  . Marital Status: Single    Spouse Name: N/A  . Number of Children: N/A  . Years of Education: N/A   Occupational History  . Not on file.   Social History Main Topics  . Smoking status: Light Tobacco Smoker -- 0.30 packs/day for 12 years    Types: Cigarettes  . Smokeless tobacco: Former Systems developer    Quit date: 06/24/2014  . Alcohol Use: 0.0 oz/week    0 Standard drinks or equivalent per week     Comment: rarely  . Drug Use: Not on file  . Sexual Activity: Not on file   Other Topics Concern  . Not on file   Social History Narrative    FH:  Family History  Problem Relation Age of Onset  . CVA Mother     Past Medical History  Diagnosis Date  . Chronic systolic CHF   . Stroke     2010  . HTN (hypertension)   . Tobacco abuse   . Non-ischemic cardiomyopathy     Current Outpatient Prescriptions  Medication Sig Dispense Refill  . acetaminophen (TYLENOL) 325 MG tablet Take  2 tablets (650 mg total) by mouth every 6 (six) hours as needed for mild pain, moderate pain, fever or headache (or Fever >/= 101).    Marland Kitchen atorvastatin (LIPITOR) 20 MG tablet Take 1 tablet (20 mg total) by mouth daily. 90 tablet 3  . carvedilol (COREG) 25 MG tablet Take 25 mg by mouth 2 (two) times daily with a meal.    . fluticasone (FLONASE) 50 MCG/ACT nasal spray Place 2 sprays into both nostrils 2 (two) times daily as needed for allergies or rhinitis.    . furosemide (LASIX) 40 MG tablet Take 1 tablet (40 mg total) by mouth 2 (two) times daily. 60 tablet 0  . hydrALAZINE (APRESOLINE) 50 MG tablet Take 1 tablet (50 mg total) by mouth 3 (three) times daily. 90 tablet 6  . spironolactone (ALDACTONE) 25 MG tablet Take 1 tablet (25 mg total) by mouth daily. 30 tablet 3  . sacubitril-valsartan (ENTRESTO) 49-51 MG Take 1 tablet by mouth 2 (two) times daily. 60 tablet 3   No current facility-administered medications for this encounter.    Filed Vitals:   10/02/14 0925  BP: 116/64  Pulse: 70  Weight: 220 lb 4 oz (99.905 kg)  SpO2: 96%    PHYSICAL EXAM:  General:  Well appearing. No resp difficulty HEENT: normal Neck: supple. Thick. No JVD. Carotids 2+ bilaterally; no bruits. No lymphadenopathy or thryomegaly appreciated. Cor: PMI normal, gegular rate & rhythm. +s4, no murmur.  Lungs: clear Abdomen: obese, soft, nontender, nondistended. No hepatosplenomegaly. No bruits or masses. Good bowel sounds. Extremities: no cyanosis, clubbing, rash, edema Neuro: alert & orientedx3, cranial nerves grossly intact. Moves all 4 extremities w/o difficulty. Affect pleasant.   ASSESSMENT & PLAN:  1. Chronic Systolic Heart Failure: ECHO EF 20-25% with severe LVH (2/16). Nonischemic cardiomyopathy - suspect hypertensive cardiomyopathy. Coronaries normal on cath 2012 in [place].  NYHA class II symptoms, BP better on current regimen.  Compliants with meds currently. - Volume looks ok. Continue lasix 40 mg twice a  day.  - Continue Coreg 25 mg bid.  - She is on a very high lisinopril dose.  I will have her stop this and start on Entresto 49/51 bid (start 36 hrs after stopping lisinopril).  - Continue spironolactone.   - Continue hydralazine 50 mg three times a day. Unable to tolerate Imdur or bidil due to headaches.   - Followup in 1 month with repeat echo.  If EF still low, will need ICD (narrow QRS, no CRT).  2. HTN: BP better today.  3. Tobacco use: I again encouraged her to quit.   4. OSA: not using CPAP  Followup in 1 month with echo.    [name] 10/04/2014

## 2014-10-05 ENCOUNTER — Other Ambulatory Visit (HOSPITAL_COMMUNITY): Payer: Self-pay

## 2014-10-07 ENCOUNTER — Telehealth (HOSPITAL_COMMUNITY): Payer: Self-pay | Admitting: *Deleted

## 2014-10-07 NOTE — Telephone Encounter (Signed)
FKC:LEXNT with Entresto central left VM stating pt does not have insurance so she referred pt to [employer] Pt assistance.

## 2014-10-07 NOTE — Telephone Encounter (Signed)
Paperwork has been sent to [employer]

## 2014-10-08 ENCOUNTER — Emergency Department (HOSPITAL_COMMUNITY)
Admission: EM | Admit: 2014-10-08 | Discharge: 2014-10-08 | Disposition: A | Discharge status: Home / Self Care | Payer: Self-pay | Attending: Emergency Medicine | Admitting: Emergency Medicine

## 2014-10-08 ENCOUNTER — Emergency Department (HOSPITAL_COMMUNITY): Payer: Self-pay

## 2014-10-08 ENCOUNTER — Encounter (HOSPITAL_COMMUNITY): Payer: Self-pay | Admitting: *Deleted

## 2014-10-08 DIAGNOSIS — I509 Heart failure, unspecified: Secondary | ICD-10-CM | POA: Insufficient documentation

## 2014-10-08 DIAGNOSIS — Z720 Tobacco use: Secondary | ICD-10-CM | POA: Insufficient documentation

## 2014-10-08 DIAGNOSIS — Z9889 Other specified postprocedural states: Secondary | ICD-10-CM | POA: Insufficient documentation

## 2014-10-08 DIAGNOSIS — I10 Essential (primary) hypertension: Secondary | ICD-10-CM | POA: Insufficient documentation

## 2014-10-08 DIAGNOSIS — Z79899 Other long term (current) drug therapy: Secondary | ICD-10-CM | POA: Insufficient documentation

## 2014-10-08 DIAGNOSIS — Z8673 Personal history of transient ischemic attack (TIA), and cerebral infarction without residual deficits: Secondary | ICD-10-CM | POA: Insufficient documentation

## 2014-10-08 DIAGNOSIS — R0789 Other chest pain: Secondary | ICD-10-CM | POA: Insufficient documentation

## 2014-10-08 LAB — CBC
HCT: 36.9 % (ref 36.0–46.0)
HEMOGLOBIN: 11.2 g/dL — AB (ref 12.0–15.0)
MCH: 23.5 pg — AB (ref 26.0–34.0)
MCHC: 30.4 g/dL (ref 30.0–36.0)
MCV: 77.4 fL — AB (ref 78.0–100.0)
Platelets: 264 10*3/uL (ref 150–400)
RBC: 4.77 MIL/uL (ref 3.87–5.11)
RDW: 19.2 % — ABNORMAL HIGH (ref 11.5–15.5)
WBC: 11.2 10*3/uL — AB (ref 4.0–10.5)

## 2014-10-08 LAB — BASIC METABOLIC PANEL
Anion gap: 9 (ref 5–15)
BUN: 23 mg/dL — ABNORMAL HIGH (ref 6–20)
CHLORIDE: 103 mmol/L (ref 101–111)
CO2: 28 mmol/L (ref 22–32)
CREATININE: 1.5 mg/dL — AB (ref 0.44–1.00)
Calcium: 9.2 mg/dL (ref 8.9–10.3)
GFR calc Af Amer: 48 mL/min — ABNORMAL LOW (ref >60)
GFR, EST NON AFRICAN AMERICAN: 41 mL/min — AB (ref >60)
GLUCOSE: 100 mg/dL — AB (ref 65–99)
Potassium: 3.9 mmol/L (ref 3.5–5.1)
SODIUM: 140 mmol/L (ref 135–145)

## 2014-10-08 LAB — I-STAT TROPONIN, ED: Troponin i, poc: 0.04 ng/mL (ref 0.00–0.08)

## 2014-10-08 MED ORDER — MORPHINE SULFATE 4 MG/ML IJ SOLN
4.0000 mg | INTRAMUSCULAR | Status: DC | PRN
  Administered 2014-10-08: 4 mg via INTRAVENOUS
  Filled 2014-10-08: qty 1

## 2014-10-08 MED ORDER — ONDANSETRON HCL 4 MG/2ML IJ SOLN
4.0000 mg | Freq: Once | INTRAMUSCULAR | Status: AC
  Administered 2014-10-08: 4 mg via INTRAVENOUS
  Filled 2014-10-08: qty 2

## 2014-10-08 MED ORDER — HYDROCODONE-ACETAMINOPHEN 5-325 MG PO TABS: 2.0000 | ORAL_TABLET | ORAL | Status: DC | PRN

## 2014-10-08 MED ORDER — METHOCARBAMOL 500 MG PO TABS: 500.0000 mg | ORAL_TABLET | Freq: Two times a day (BID) | ORAL | Status: DC

## 2014-10-08 MED ORDER — METHOCARBAMOL 1000 MG/10ML IJ SOLN
1000.0000 mg | Freq: Once | INTRAVENOUS | Status: AC
  Administered 2014-10-08: 1000 mg via INTRAVENOUS
  Filled 2014-10-08: qty 10

## 2014-10-08 NOTE — ED Provider Notes (Signed)
CSN: [id number]     Arrival date & time 10/08/14  1722 History   First MD Initiated Contact with Patient 10/08/14 2022     Chief Complaint  Patient presents with  . rib pain       HPI  Patient is evaluation of "spasms" in her left lateral chest. Symptoms will last for 5 hours. States it hurts to twist or breathe or move. Does not feel short of breath. No frank chest pain.  Recent cough or URI symptoms. No fevers or chills. No rash across the chest wall. Compliant with her medications and stable with her chronic symptoms regarding her congestive heart failure.  Past Medical History  Diagnosis Date  . Chronic systolic CHF   . Stroke     2010  . HTN (hypertension)   . Tobacco abuse   . Non-ischemic cardiomyopathy    Past Surgical History  Procedure Laterality Date  . Cardiac catheterization      2012  . Carotid stent insertion      2010  . Cesarean section     Family History  Problem Relation Age of Onset  . CVA Mother    History  Substance Use Topics  . Smoking status: Light Tobacco Smoker -- 0.30 packs/day for 12 years    Types: Cigarettes  . Smokeless tobacco: Former Systems developer    Quit date: 06/24/2014  . Alcohol Use: 0.0 oz/week    0 Standard drinks or equivalent per week     Comment: rarely   OB History    No data available     Review of Systems  Constitutional: Negative for fever, chills, diaphoresis, appetite change and fatigue.  HENT: Negative for mouth sores, sore throat and trouble swallowing.   Eyes: Negative for visual disturbance.  Respiratory: Negative for cough, chest tightness, shortness of breath and wheezing.   Cardiovascular: Positive for chest pain.  Gastrointestinal: Negative for nausea, vomiting, abdominal pain, diarrhea and abdominal distention.  Endocrine: Negative for polydipsia, polyphagia and polyuria.  Genitourinary: Negative for dysuria, frequency and hematuria.  Musculoskeletal: Negative for gait problem.  Skin: Negative for color  change, pallor and rash.  Neurological: Negative for dizziness, syncope, light-headedness and headaches.  Hematological: Does not bruise/bleed easily.  Psychiatric/Behavioral: Negative for behavioral problems and confusion.      Allergies  Sulfonamide derivatives  Home Medications   Prior to Admission medications   Medication Sig Start Date End Date Taking? Authorizing Provider  acetaminophen (TYLENOL) 325 MG tablet Take 2 tablets (650 mg total) by mouth every 6 (six) hours as needed for mild pain, moderate pain, fever or headache (or Fever >/= 101). 06/12/14  Yes Modena Jansky, MD  atorvastatin (LIPITOR) 20 MG tablet Take 1 tablet (20 mg total) by mouth daily. 09/15/14  Yes Amy D Clegg, NP  carvedilol (COREG) 25 MG tablet Take 25 mg by mouth 2 (two) times daily with a meal.   Yes Historical Provider, MD  ferrous sulfate (FER-IN-SOL) 75 (15 FE) MG/ML SOLN Take 15 mg of iron by mouth 2 (two) times daily.   Yes Historical Provider, MD  furosemide (LASIX) 40 MG tablet Take 1 tablet (40 mg total) by mouth 2 (two) times daily. 06/12/14  Yes Modena Jansky, MD  hydrALAZINE (APRESOLINE) 50 MG tablet Take 1 tablet (50 mg total) by mouth 3 (three) times daily. 09/15/14  Yes Shaune Pascal Bensimhon, MD  sacubitril-valsartan (ENTRESTO) 49-51 MG Take 1 tablet by mouth 2 (two) times daily. 10/02/14  Yes Larey Dresser,  MD  spironolactone (ALDACTONE) 25 MG tablet Take 1 tablet (25 mg total) by mouth daily. 07/08/14  Yes Amy D Clegg, NP  trimethoprim-polymyxin b (POLYTRIM) ophthalmic solution Place 1 drop into both eyes 3 (three) times daily.   Yes Historical Provider, MD  HYDROcodone-acetaminophen (NORCO/VICODIN) 5-325 MG per tablet Take 2 tablets by mouth every 4 (four) hours as needed. 10/08/14   Tanna Furry, MD  methocarbamol (ROBAXIN) 500 MG tablet Take 1 tablet (500 mg total) by mouth 2 (two) times daily. 10/08/14   Tanna Furry, MD   BP 162/70 mmHg  Pulse 81  Temp(Src) 98.4 F (36.9 C) (Oral)  Resp 18   SpO2 97%  LMP 09/16/2014 Physical Exam  Constitutional: She is oriented to person, place, and time. She appears well-developed and well-nourished. No distress.  HENT:  Head: Normocephalic.  Eyes: Conjunctivae are normal. Pupils are equal, round, and reactive to light. No scleral icterus.  Neck: Normal range of motion. Neck supple. No thyromegaly present.  Cardiovascular: Normal rate and regular rhythm.  Exam reveals no gallop and no friction rub.   No murmur heard. Pulmonary/Chest: Effort normal and breath sounds normal. No respiratory distress. She has no wheezes. She has no rales.      Abdominal: Soft. Bowel sounds are normal. She exhibits no distension. There is no tenderness. There is no rebound.  Musculoskeletal: Normal range of motion.  Neurological: She is alert and oriented to person, place, and time.  Skin: Skin is warm and dry. No rash noted.  Psychiatric: She has a normal mood and affect. Her behavior is normal.    ED Course  Procedures (including critical care time) Labs Review Labs Reviewed  CBC - Abnormal; Notable for the following:    WBC 11.2 (*)    Hemoglobin 11.2 (*)    MCV 77.4 (*)    MCH 23.5 (*)    RDW 19.2 (*)    All other components within normal limits  BASIC METABOLIC PANEL - Abnormal; Notable for the following:    Glucose, Bld 100 (*)    BUN 23 (*)    Creatinine, Ser 1.50 (*)    GFR calc non Af Amer 41 (*)    GFR calc Af Amer 48 (*)    All other components within normal limits  I-STAT TROPOININ, ED    Imaging Review Dg Chest 2 View  10/08/2014   CLINICAL DATA:  Chest pain radiating into the low back today. Initial encounter. No known injury.  EXAM: CHEST  2 VIEW  COMPARISON:  CT chest 06/10/2014.  PA and lateral chest 06/09/2014.  FINDINGS: There is cardiomegaly without edema. No pneumothorax or pleural effusion. Lungs are clear.  IMPRESSION: Cardiomegaly without acute disease.   Electronically Signed   By: Inge Rise M.D.   On: 10/08/2014  18:00     EKG Interpretation None      MDM   Final diagnoses:  Chest wall pain    Patient resting comfortably after medications. Chest x-ray without acute findings. EKG without change. Normal troponin.    Tanna Furry, MD 10/08/14 [phone]

## 2014-10-08 NOTE — ED Notes (Signed)
Pt reports she started having left rib pain/chest, breast area pain around 1200 today. Reports "feels like a kink in her lung". Pt denies SOB, able to speak in full sentences. Pain 8/10.

## 2014-10-08 NOTE — Discharge Instructions (Signed)

## 2014-10-13 ENCOUNTER — Emergency Department (HOSPITAL_COMMUNITY)
Admission: EM | Admit: 2014-10-13 | Discharge: 2014-10-14 | Disposition: A | Discharge status: Home / Self Care | Payer: Self-pay | Attending: Emergency Medicine | Admitting: Emergency Medicine

## 2014-10-13 ENCOUNTER — Encounter (HOSPITAL_COMMUNITY): Payer: Self-pay | Admitting: Emergency Medicine

## 2014-10-13 DIAGNOSIS — R062 Wheezing: Secondary | ICD-10-CM | POA: Insufficient documentation

## 2014-10-13 DIAGNOSIS — Z9889 Other specified postprocedural states: Secondary | ICD-10-CM | POA: Insufficient documentation

## 2014-10-13 DIAGNOSIS — Z720 Tobacco use: Secondary | ICD-10-CM | POA: Insufficient documentation

## 2014-10-13 DIAGNOSIS — Z79899 Other long term (current) drug therapy: Secondary | ICD-10-CM | POA: Insufficient documentation

## 2014-10-13 DIAGNOSIS — D259 Leiomyoma of uterus, unspecified: Secondary | ICD-10-CM | POA: Insufficient documentation

## 2014-10-13 DIAGNOSIS — R1030 Lower abdominal pain, unspecified: Secondary | ICD-10-CM

## 2014-10-13 DIAGNOSIS — Z3202 Encounter for pregnancy test, result negative: Secondary | ICD-10-CM | POA: Insufficient documentation

## 2014-10-13 DIAGNOSIS — I10 Essential (primary) hypertension: Secondary | ICD-10-CM | POA: Insufficient documentation

## 2014-10-13 DIAGNOSIS — I5022 Chronic systolic (congestive) heart failure: Secondary | ICD-10-CM | POA: Insufficient documentation

## 2014-10-13 DIAGNOSIS — R102 Pelvic and perineal pain: Secondary | ICD-10-CM

## 2014-10-13 DIAGNOSIS — R9389 Abnormal findings on diagnostic imaging of other specified body structures: Secondary | ICD-10-CM

## 2014-10-13 DIAGNOSIS — Z8673 Personal history of transient ischemic attack (TIA), and cerebral infarction without residual deficits: Secondary | ICD-10-CM | POA: Insufficient documentation

## 2014-10-13 NOTE — ED Notes (Signed)
Pt complaining of pain in her "ovary, pelvic, and kidney." States the pain started yesterday while she was at work, after squatting down she began having back pain. She tried OTC medication to help with pain. When pointing to the stabbing pain she states it's in her upper/lower back radiating into bilateral lower abdomin. Complaining of nausea and chills, no vomiting/diarrhea/fever

## 2014-10-14 ENCOUNTER — Other Ambulatory Visit (HOSPITAL_COMMUNITY): Payer: Self-pay

## 2014-10-14 ENCOUNTER — Emergency Department (HOSPITAL_COMMUNITY): Payer: Self-pay

## 2014-10-14 LAB — CBC WITH DIFFERENTIAL/PLATELET
BASOS ABS: 0 10*3/uL (ref 0.0–0.1)
Basophils Relative: 0 % (ref 0–1)
EOS ABS: 0.2 10*3/uL (ref 0.0–0.7)
EOS PCT: 2 % (ref 0–5)
HEMATOCRIT: 35.1 % — AB (ref 36.0–46.0)
HEMOGLOBIN: 10.9 g/dL — AB (ref 12.0–15.0)
LYMPHS PCT: 13 % (ref 12–46)
Lymphs Abs: 1.3 10*3/uL (ref 0.7–4.0)
MCH: 24.1 pg — ABNORMAL LOW (ref 26.0–34.0)
MCHC: 31.1 g/dL (ref 30.0–36.0)
MCV: 77.7 fL — ABNORMAL LOW (ref 78.0–100.0)
Monocytes Absolute: 1.1 10*3/uL — ABNORMAL HIGH (ref 0.1–1.0)
Monocytes Relative: 10 % (ref 3–12)
Neutro Abs: 7.8 10*3/uL — ABNORMAL HIGH (ref 1.7–7.7)
Neutrophils Relative %: 75 % (ref 43–77)
Platelets: 220 10*3/uL (ref 150–400)
RBC: 4.52 MIL/uL (ref 3.87–5.11)
RDW: 20 % — AB (ref 11.5–15.5)
WBC: 10.4 10*3/uL (ref 4.0–10.5)

## 2014-10-14 LAB — WET PREP, GENITAL
CLUE CELLS WET PREP: NONE SEEN
Trich, Wet Prep: NONE SEEN
YEAST WET PREP: NONE SEEN

## 2014-10-14 LAB — URINALYSIS, ROUTINE W REFLEX MICROSCOPIC
Bilirubin Urine: NEGATIVE
GLUCOSE, UA: NEGATIVE mg/dL
HGB URINE DIPSTICK: NEGATIVE
KETONES UR: NEGATIVE mg/dL
Leukocytes, UA: NEGATIVE
NITRITE: NEGATIVE
PH: 5.5 (ref 5.0–8.0)
PROTEIN: NEGATIVE mg/dL
Specific Gravity, Urine: 1.022 (ref 1.005–1.030)
Urobilinogen, UA: 0.2 mg/dL (ref 0.0–1.0)

## 2014-10-14 LAB — I-STAT CHEM 8, ED
BUN: 19 mg/dL (ref 6–20)
CALCIUM ION: 1.16 mmol/L (ref 1.12–1.23)
Chloride: 105 mmol/L (ref 101–111)
Creatinine, Ser: 1.4 mg/dL — ABNORMAL HIGH (ref 0.44–1.00)
Glucose, Bld: 103 mg/dL — ABNORMAL HIGH (ref 65–99)
HEMATOCRIT: 38 % (ref 36.0–46.0)
HEMOGLOBIN: 12.9 g/dL (ref 12.0–15.0)
POTASSIUM: 3.7 mmol/L (ref 3.5–5.1)
SODIUM: 141 mmol/L (ref 135–145)
TCO2: 24 mmol/L (ref 0–100)

## 2014-10-14 LAB — GC/CHLAMYDIA PROBE AMP (~~LOC~~) NOT AT ARMC
Chlamydia: NEGATIVE
Neisseria Gonorrhea: NEGATIVE

## 2014-10-14 LAB — I-STAT TROPONIN, ED: Troponin i, poc: 0.04 ng/mL (ref 0.00–0.08)

## 2014-10-14 LAB — POC URINE PREG, ED: Preg Test, Ur: NEGATIVE

## 2014-10-14 MED ORDER — KETOROLAC TROMETHAMINE 30 MG/ML IJ SOLN
30.0000 mg | Freq: Once | INTRAMUSCULAR | Status: AC
Start: 2014-10-14 — End: 2014-10-14
  Administered 2014-10-14: 30 mg via INTRAMUSCULAR
  Filled 2014-10-14: qty 1

## 2014-10-14 MED ORDER — TRAMADOL HCL 50 MG PO TABS: 50.0000 mg | ORAL_TABLET | Freq: Four times a day (QID) | ORAL | Status: DC | PRN

## 2014-10-14 MED ORDER — NAPROXEN 500 MG PO TABS: 500.0000 mg | ORAL_TABLET | Freq: Two times a day (BID) | ORAL | Status: DC

## 2014-10-14 NOTE — ED Notes (Signed)
Patient at ultra sound.  Unable to do reassess at this time.

## 2014-10-14 NOTE — Discharge Instructions (Signed)
Naproxen for pain. Tramadol for severe pain as needed only. Please follow-up for repeat ultrasound after your cycle soon as able. Your endometrium, or thickness of the uterus is enlarged and this requires further evaluation. Return here or go to [facility] of pain is worsening.  Pelvic Pain Female pelvic pain can be caused by many different things and start from a variety of places. Pelvic pain refers to pain that is located in the lower half of the abdomen and between your hips. The pain may occur over a short period of time (acute) or may be reoccurring (chronic). The cause of pelvic pain may be related to disorders affecting the female reproductive organs (gynecologic), but it may also be related to the bladder, kidney stones, an intestinal complication, or muscle or skeletal problems. Getting help right away for pelvic pain is important, especially if there has been severe, sharp, or a sudden onset of unusual pain. It is also important to get help right away because some types of pelvic pain can be life threatening.  CAUSES  Below are only some of the causes of pelvic pain. The causes of pelvic pain can be in one of several categories.   Gynecologic.  Pelvic inflammatory disease.  Sexually transmitted infection.  Ovarian cyst or a twisted ovarian ligament (ovarian torsion).  Uterine lining that grows outside the uterus (endometriosis).  Fibroids, cysts, or tumors.  Ovulation.  Pregnancy.  Pregnancy that occurs outside the uterus (ectopic pregnancy).  Miscarriage.  Labor.  Abruption of the placenta or ruptured uterus.  Infection.  Uterine infection (endometritis).  Bladder infection.  Diverticulitis.  Miscarriage related to a uterine infection (septic abortion).  Bladder.  Inflammation of the bladder (cystitis).  Kidney stone(s).  Gastrointestinal.  Constipation.  Diverticulitis.  Neurologic.  Trauma.  Feeling pelvic pain because of mental or  emotional causes (psychosomatic).  Cancers of the bowel or pelvis. EVALUATION  Your caregiver will want to take a careful history of your concerns. This includes recent changes in your health, a careful gynecologic history of your periods (menses), and a sexual history. Obtaining your family history and medical history is also important. Your caregiver may suggest a pelvic exam. A pelvic exam will help identify the location and severity of the pain. It also helps in the evaluation of which organ system may be involved. In order to identify the cause of the pelvic pain and be properly treated, your caregiver may order tests. These tests may include:   A pregnancy test.  Pelvic ultrasonography.  An X-ray exam of the abdomen.  A urinalysis or evaluation of vaginal discharge.  Blood tests. HOME CARE INSTRUCTIONS   Only take over-the-counter or prescription medicines for pain, discomfort, or fever as directed by your caregiver.   Rest as directed by your caregiver.   Eat a balanced diet.   Drink enough fluids to make your urine clear or pale yellow, or as directed.   Avoid sexual intercourse if it causes pain.   Apply warm or cold compresses to the lower abdomen depending on which one helps the pain.   Avoid stressful situations.   Keep a journal of your pelvic pain. Write down when it started, where the pain is located, and if there are things that seem to be associated with the pain, such as food or your menstrual cycle.  Follow up with your caregiver as directed.  SEEK MEDICAL CARE IF:  Your medicine does not help your pain.  You have abnormal vaginal discharge. [place]  CARE IF:   You have heavy bleeding from the vagina.   Your pelvic pain increases.   You feel light-headed or faint.   You have chills.   You have pain with urination or blood in your urine.   You have uncontrolled diarrhea or vomiting.   You have a fever or persistent  symptoms for more than 3 days.  You have a fever and your symptoms suddenly get worse.   You are being physically or sexually abused.  MAKE SURE YOU:  Understand these instructions.  Will watch your condition.  Will get help if you are not doing well or get worse. Document Released: 02/29/2004 Document Revised: 08/18/2013 Document Reviewed: 07/24/2011 [facility] Patient Information 2015 [place], [place]. This information is not intended to replace advice given to you by your health care provider. Make sure you discuss any questions you have with your health care provider.  Uterine Fibroid A uterine fibroid is a growth (tumor) that occurs in your uterus. This type of tumor is not cancerous and does not spread out of the uterus. You can have one or many fibroids. Fibroids can vary in size, weight, and where they grow in the uterus. Some can become quite large. Most fibroids do not require medical treatment, but some can cause pain or heavy bleeding during and between periods. CAUSES  A fibroid is the result of a single uterine cell that keeps growing (unregulated), which is different than most cells in the human body. Most cells have a control mechanism that keeps them from reproducing without control.  SIGNS AND SYMPTOMS   Bleeding.  Pelvic pain and pressure.  Bladder problems due to the size of the fibroid.  Infertility and miscarriages depending on the size and location of the fibroid. DIAGNOSIS  Uterine fibroids are diagnosed through a physical exam. Your health care provider may feel the lumpy tumors during a pelvic exam. Ultrasonography may be done to get information regarding size, location, and number of tumors.  TREATMENT   Your health care provider may recommend watchful waiting. This involves getting the fibroid checked by your health care provider to see if it grows or shrinks.   Hormone treatment or an intrauterine device (IUD) may be prescribed.   Surgery may be  needed to remove the fibroids (myomectomy) or the uterus (hysterectomy). This depends on your situation. When fibroids interfere with fertility and a woman wants to become pregnant, a health care provider may recommend having the fibroids removed.  [place] care depends on how you were treated. In general:   Keep all follow-up appointments with your health care provider.   Only take over-the-counter or prescription medicines as directed by your health care provider. If you were prescribed a hormone treatment, take the hormone medicines exactly as directed. Do not take aspirin. It can cause bleeding.   Talk to your health care provider about taking iron pills.  If your periods are troublesome but not so heavy, lie down with your feet raised slightly above your heart. Place cold packs on your lower abdomen.   If your periods are heavy, write down the number of pads or tampons you use per month. Bring this information to your health care provider.   Include green vegetables in your diet.  SEEK IMMEDIATE MEDICAL CARE IF:  You have pelvic pain or cramps not controlled with medicines.   You have a sudden increase in pelvic pain.   You have an increase in bleeding between and during periods.  You have excessive periods and soak tampons or pads in a half hour or less.  You feel lightheaded or have fainting episodes. Document Released: 03/31/2000 Document Revised: 01/22/2013 Document Reviewed: 10/31/2012 [facility] Patient Information 2015 [place], [place]. This information is not intended to replace advice given to you by your health care provider. Make sure you discuss any questions you have with your health care provider.

## 2014-10-14 NOTE — ED Provider Notes (Signed)
8:52 AM Patient seen and examined. Patient initially seen by NP Olean Ree, patient presented to the emergency department with lower abdominal cramping that started yesterday. States pain radiated around to the back. She is currently on her period. Reports nausea, chills, denies any vomiting. No changes in her bowels. Patient was thoroughly evaluated, blood work showed hemoglobin of 10.9, creatinine 1.4, no other abnormalities. Pelvic exam unremarkable. Urinalysis is clear. Ultrasound of the pelvis performed. Ultrasound showed thickening of endometrium, recommended repeat ultrasound in 6-8 weeks. Also also showed uterine fibroids with the largest one measuring 6.5 x 6 x 7 cm.  I discussed results with the patient. She had present does not have a primary care doctor, but is planning on going to the [place], and will apply four-inch guard. In meantime I instructed her to follow-up either with a [facility] clinic or [facility] or return here for pain worsening. Patient voiced understanding. She will will follow-up as soon as she is able or return if any new concerning symptoms. I will discharge her home with naproxen, tramadol.  At time of discharge, pain improved, patient has been in emergency department for an 11-1/2 hours now, she was given Toradol at 3:30 AM, which is over 5 hours ago. Her abdomen is benign with no guarding or rebound tenderness. Minimal tenderness in the lower abdomen at this time. She is stable for discharge home.  Filed Vitals:   10/14/14 0116 10/14/14 0249 10/14/14 0457 10/14/14 0819  BP: 129/61 173/93 149/73 125/84  Pulse: 81 86 83 80  Temp: 98.3 F (36.8 C) 98.3 F (36.8 C) 98.3 F (36.8 C)   TempSrc: Oral Oral Oral   Resp: 20 20 20 18   SpO2: 98% 97% 95% 97%     Jeannett Senior, PA-C 10/14/14 3149  Ernestina Patches, MD 10/16/14 1222

## 2014-10-14 NOTE — ED Provider Notes (Signed)
CSN: [id number]     Arrival date & time 10/13/14  2116 History   First MD Initiated Contact with Patient 10/14/14 0153     Chief Complaint  Patient presents with  . Flank Pain  . Abdominal Pain  . Nausea     (Consider location/radiation/quality/duration/timing/severity/associated sxs/prior Treatment) HPI Comments: Patient states that he has pain in her left lung base, cramping pain that radiates to her right kidney in her right ovary and now down into her suprapubic area.  This all started several days ago.  She was seen in the ED several days ago and was diagnosed with chest wall pain and given prescription for Robaxin on Diflucan at which he states has not helped her discomfort at all.  She now states that she has started her cycle.  Denies any vaginal discharge prior to the start of her menses.  She states she is not sexually active.  She has a history of PID is a 44 year old but none since.  Patient is a 44 y.o. female presenting with flank pain and abdominal pain. The history is provided by the patient.  Flank Pain This is a recurrent problem. The current episode started in the past 7 days. The problem occurs constantly. The problem has been gradually worsening. Associated symptoms include abdominal pain and urinary symptoms. Pertinent negatives include no coughing, fever, nausea or vomiting. Nothing aggravates the symptoms. She has tried nothing for the symptoms. The treatment provided no relief.  Abdominal Pain Associated symptoms: vaginal bleeding   Associated symptoms: no constipation, no cough, no diarrhea, no dysuria, no fever, no nausea, no shortness of breath, no vaginal discharge and no vomiting     Past Medical History  Diagnosis Date  . Chronic systolic CHF   . Stroke     2010  . HTN (hypertension)   . Tobacco abuse   . Non-ischemic cardiomyopathy    Past Surgical History  Procedure Laterality Date  . Cardiac catheterization      2012  . Carotid stent insertion       2010  . Cesarean section     Family History  Problem Relation Age of Onset  . CVA Mother    History  Substance Use Topics  . Smoking status: Light Tobacco Smoker -- 0.30 packs/day for 12 years    Types: Cigarettes  . Smokeless tobacco: Former Systems developer    Quit date: 06/24/2014  . Alcohol Use: 0.0 oz/week    0 Standard drinks or equivalent per week     Comment: rarely   OB History    No data available     Review of Systems  Constitutional: Negative for fever.  Respiratory: Positive for wheezing. Negative for cough and shortness of breath.   Gastrointestinal: Positive for abdominal pain. Negative for nausea, vomiting, diarrhea and constipation.  Genitourinary: Positive for flank pain and vaginal bleeding. Negative for dysuria, vaginal discharge and vaginal pain.  Musculoskeletal: Positive for back pain.  All other systems reviewed and are negative.     Allergies  Sulfonamide derivatives  Home Medications   Prior to Admission medications   Medication Sig Start Date End Date Taking? Authorizing Provider  acetaminophen (TYLENOL) 325 MG tablet Take 2 tablets (650 mg total) by mouth every 6 (six) hours as needed for mild pain, moderate pain, fever or headache (or Fever >/= 101). 06/12/14  Yes Modena Jansky, MD  atorvastatin (LIPITOR) 20 MG tablet Take 1 tablet (20 mg total) by mouth daily. 09/15/14  Yes Amy D  Clegg, NP  carvedilol (COREG) 25 MG tablet Take 25 mg by mouth 2 (two) times daily with a meal.   Yes Historical Provider, MD  ferrous sulfate (FER-IN-SOL) 75 (15 FE) MG/ML SOLN Take 15 mg of iron by mouth 2 (two) times daily.   Yes Historical Provider, MD  furosemide (LASIX) 40 MG tablet Take 1 tablet (40 mg total) by mouth 2 (two) times daily. 06/12/14  Yes Modena Jansky, MD  hydrALAZINE (APRESOLINE) 50 MG tablet Take 1 tablet (50 mg total) by mouth 3 (three) times daily. 09/15/14  Yes Jolaine Artist, MD  Ibuprofen (MIDOL) 200 MG CAPS Take 400 mg by mouth every 8  (eight) hours as needed (for pain.).   Yes Historical Provider, MD  methocarbamol (ROBAXIN) 500 MG tablet Take 1 tablet (500 mg total) by mouth 2 (two) times daily. 10/08/14  Yes Tanna Furry, MD  sacubitril-valsartan (ENTRESTO) 49-51 MG Take 1 tablet by mouth 2 (two) times daily. 10/02/14  Yes Larey Dresser, MD  spironolactone (ALDACTONE) 25 MG tablet Take 1 tablet (25 mg total) by mouth daily. 07/08/14  Yes Amy D Clegg, NP  HYDROcodone-acetaminophen (NORCO/VICODIN) 5-325 MG per tablet Take 2 tablets by mouth every 4 (four) hours as needed. Patient not taking: Reported on 10/14/2014 10/08/14   Tanna Furry, MD  naproxen (NAPROSYN) 500 MG tablet Take 1 tablet (500 mg total) by mouth 2 (two) times daily. 10/14/14   Tatyana Kirichenko, PA-C  traMADol (ULTRAM) 50 MG tablet Take 1 tablet (50 mg total) by mouth every 6 (six) hours as needed. 10/14/14   Tatyana Kirichenko, PA-C   BP 125/84 mmHg  Pulse 80  Temp(Src) 98.3 F (36.8 C) (Oral)  Resp 18  SpO2 97%  LMP 09/16/2014 Physical Exam  Constitutional: She is oriented to person, place, and time. She appears well-developed and well-nourished.  HENT:  Head: Normocephalic.  Eyes: Pupils are equal, round, and reactive to light.  Neck: Normal range of motion.  Cardiovascular: Normal rate.   Pulmonary/Chest: Effort normal.  Abdominal: Soft. Bowel sounds are normal. There is tenderness.  Musculoskeletal: Normal range of motion.  Neurological: She is alert and oriented to person, place, and time.  Skin: Skin is warm. No rash noted. No erythema.  Nursing note and vitals reviewed.   ED Course  Procedures (including critical care time) Labs Review Labs Reviewed  WET PREP, GENITAL - Abnormal; Notable for the following:    WBC, Wet Prep HPF POC FEW (*)    All other components within normal limits  CBC WITH DIFFERENTIAL/PLATELET - Abnormal; Notable for the following:    Hemoglobin 10.9 (*)    HCT 35.1 (*)    MCV 77.7 (*)    MCH 24.1 (*)    RDW 20.0  (*)    Neutro Abs 7.8 (*)    Monocytes Absolute 1.1 (*)    All other components within normal limits  I-STAT CHEM 8, ED - Abnormal; Notable for the following:    Creatinine, Ser 1.40 (*)    Glucose, Bld 103 (*)    All other components within normal limits  URINALYSIS, ROUTINE W REFLEX MICROSCOPIC (NOT AT [facility])  POC URINE PREG, ED  I-STAT TROPOININ, ED  GC/CHLAMYDIA PROBE AMP ([place]) NOT AT [facility]    Imaging Review Dg Chest 2 View  10/14/2014   CLINICAL DATA:  Wheezing.  Flank pain.  Symptoms for 1 week.  EXAM: CHEST  2 VIEW  COMPARISON:  Chest radiographs 10/08/2014, chest CT 06/10/2014  FINDINGS:  Cardiomegaly is unchanged. No pulmonary edema, confluent airspace disease, pleural effusion or pneumothorax. No acute osseous abnormality.  IMPRESSION: Stable cardiomegaly. No congestive failure or localizing pulmonary process.   Electronically Signed   By: Jeb Levering M.D.   On: 10/14/2014 03:07   US Transvaginal Non-ob  10/14/2014   CLINICAL DATA:  Bilateral lower pelvic pain for 3 days.  Nausea.  EXAM: TRANSABDOMINAL AND TRANSVAGINAL ULTRASOUND OF PELVIS  TECHNIQUE: Both transabdominal and transvaginal ultrasound examinations of the pelvis were performed. Transabdominal technique was performed for global imaging of the pelvis including uterus, ovaries, adnexal regions, and pelvic cul-de-sac. It was necessary to proceed with endovaginal exam following the transabdominal exam to visualize the endometrium.  COMPARISON:  None  FINDINGS: Uterus  Measurements: Fibroid uterus measuring 18.4 cm x 9.4 cm x 11.7 cm. The number of fibroids are too numerous to count. Most of these are myometrial but there are also a sub serosal and subendometrial fibroids. The largest is in the posterior fundus measuring 6.4 x 5.9 x 6.9 cm. This large fibroid is myometrial in location but extends to the subendometrial position.  Endometrium  Thickness: Diffuse thickening with heterogeneous echotexture, measuring 32 mm.   Right ovary  Measurements: 45 mm x 27 mm x 32 mm. Normal appearance/no adnexal mass.  Left ovary  Measurements: 38 mm x 19 mm x 32 mm. Normal appearance/no adnexal mass.  Other findings  Small amount of free fluid, physiologic.  Debris is present within the urinary bladder. Correlation with urinalysis recommended.  IMPRESSION: 1. Fibroid uterus. 2. Diffuse endometrial thickening measuring 32 mm. Endometrial thickness is considered abnormal. Consider follow-up by [place] in 6-8 weeks, during the week immediately following menses (exam timing is critical). 3. Debris within the urinary bladder incidentally noted. Recommend correlation with urinalysis.   Electronically Signed   By: Dereck Ligas M.D.   On: 10/14/2014 08:38   US Pelvis Complete  10/14/2014   CLINICAL DATA:  Bilateral lower pelvic pain for 3 days.  Nausea.  EXAM: TRANSABDOMINAL AND TRANSVAGINAL ULTRASOUND OF PELVIS  TECHNIQUE: Both transabdominal and transvaginal ultrasound examinations of the pelvis were performed. Transabdominal technique was performed for global imaging of the pelvis including uterus, ovaries, adnexal regions, and pelvic cul-de-sac. It was necessary to proceed with endovaginal exam following the transabdominal exam to visualize the endometrium.  COMPARISON:  None  FINDINGS: Uterus  Measurements: Fibroid uterus measuring 18.4 cm x 9.4 cm x 11.7 cm. The number of fibroids are too numerous to count. Most of these are myometrial but there are also a sub serosal and subendometrial fibroids. The largest is in the posterior fundus measuring 6.4 x 5.9 x 6.9 cm. This large fibroid is myometrial in location but extends to the subendometrial position.  Endometrium  Thickness: Diffuse thickening with heterogeneous echotexture, measuring 32 mm.  Right ovary  Measurements: 45 mm x 27 mm x 32 mm. Normal appearance/no adnexal mass.  Left ovary  Measurements: 38 mm x 19 mm x 32 mm. Normal appearance/no adnexal mass.  Other findings  Small amount of  free fluid, physiologic.  Debris is present within the urinary bladder. Correlation with urinalysis recommended.  IMPRESSION: 1. Fibroid uterus. 2. Diffuse endometrial thickening measuring 32 mm. Endometrial thickness is considered abnormal. Consider follow-up by [place] in 6-8 weeks, during the week immediately following menses (exam timing is critical). 3. Debris within the urinary bladder incidentally noted. Recommend correlation with urinalysis.   Electronically Signed   By: Dereck Ligas M.D.   On: 10/14/2014 08:38  EKG Interpretation None      MDM   Final diagnoses:  Wheeze  Lower abdominal pain  Thickened endometrium  Uterine leiomyoma, unspecified location        Junius Creamer, NP 10/14/14 1952  Julianne Rice, MD 10/14/14 2321

## 2014-10-22 ENCOUNTER — Other Ambulatory Visit (HOSPITAL_COMMUNITY): Payer: Self-pay | Admitting: *Deleted

## 2014-10-22 MED ORDER — CARVEDILOL 25 MG PO TABS: 25.0000 mg | ORAL_TABLET | Freq: Two times a day (BID) | ORAL | Status: DC

## 2014-10-23 ENCOUNTER — Other Ambulatory Visit (HOSPITAL_COMMUNITY): Payer: Self-pay | Admitting: *Deleted

## 2014-10-23 MED ORDER — CARVEDILOL 25 MG PO TABS: 25.0000 mg | ORAL_TABLET | Freq: Two times a day (BID) | ORAL | Status: DC

## 2014-10-30 ENCOUNTER — Ambulatory Visit (HOSPITAL_COMMUNITY)
Admission: RE | Admit: 2014-10-30 | Discharge: 2014-10-30 | Disposition: A | Discharge status: Home / Self Care | Payer: Self-pay | Source: Ambulatory Visit | Attending: Pediatrics | Admitting: Pediatrics

## 2014-10-30 ENCOUNTER — Ambulatory Visit (HOSPITAL_BASED_OUTPATIENT_CLINIC_OR_DEPARTMENT_OTHER)
Admission: RE | Admit: 2014-10-30 | Discharge: 2014-10-30 | Disposition: A | Discharge status: Home / Self Care | Payer: Self-pay | Source: Ambulatory Visit | Attending: Cardiology | Admitting: Cardiology

## 2014-10-30 VITALS — BP 116/80 | HR 82 | Wt 220.0 lb

## 2014-10-30 DIAGNOSIS — I5022 Chronic systolic (congestive) heart failure: Secondary | ICD-10-CM

## 2014-10-30 DIAGNOSIS — F17200 Nicotine dependence, unspecified, uncomplicated: Secondary | ICD-10-CM

## 2014-10-30 DIAGNOSIS — I10 Essential (primary) hypertension: Secondary | ICD-10-CM | POA: Insufficient documentation

## 2014-10-30 DIAGNOSIS — Z720 Tobacco use: Secondary | ICD-10-CM

## 2014-10-30 DIAGNOSIS — I517 Cardiomegaly: Secondary | ICD-10-CM | POA: Insufficient documentation

## 2014-10-30 LAB — BASIC METABOLIC PANEL
ANION GAP: 8 (ref 5–15)
BUN: 18 mg/dL (ref 6–20)
CO2: 26 mmol/L (ref 22–32)
CREATININE: 1.17 mg/dL — AB (ref 0.44–1.00)
Calcium: 9.2 mg/dL (ref 8.9–10.3)
Chloride: 106 mmol/L (ref 101–111)
GFR calc Af Amer: >60 mL/min (ref >60)
GFR calc non Af Amer: 56 mL/min — ABNORMAL LOW (ref >60)
Glucose, Bld: 80 mg/dL (ref 65–99)
Potassium: 3.9 mmol/L (ref 3.5–5.1)
Sodium: 140 mmol/L (ref 135–145)

## 2014-10-30 NOTE — Progress Notes (Signed)
  Echocardiogram 2D Echocardiogram has been performed.  Jennette Dubin 10/30/2014, 11:44 AM

## 2014-10-30 NOTE — Patient Instructions (Signed)
Labs today.  Your physician recommends that you schedule a follow-up appointment in: 3 months  Do the following things EVERYDAY: 1) Weigh yourself in the morning before breakfast. Write it down and keep it in a log. 2) Take your medicines as prescribed 3) Eat low salt foods-Limit salt (sodium) to 2000 mg per day.  4) Stay as active as you can everyday 5) Limit all fluids for the day to less than 2 liters 6)   

## 2014-11-01 NOTE — Progress Notes (Signed)
Patient ID: [name], female   DOB: 1971/02/01, 44 y.o.   MRN: [id number] PCP: None   HPI: Ms Hollars is a 44 yo female with history of chronic systolic HF due to nonischemic cardiomyopathy dating back to at least 2009, HTN, DM, CVA 2010, tobacco abuse, Had cath 2011 at [facility] with normal coronaries.    Admitted to [facility] with respiratory distress due to bronchitis in 2/16. She had been off HF meds as well due to noncompliance. These were resumed. Discharge weight was 227 pounds.   She is taking her medications as ordered.  BP today is well-controlled.  She denies dyspnea or chest pain.  She is doing custodial work. She is walking for exercise.  Weight is stable.    Labs 06/12/2014 K 4.1 Creatinine 1.42  Labs 06/25/2014: 4.4 Creatinine 1.29   ECHO 06/10/2014: EF 20-25%, severe LVH, severe LAE. RV ok.  ECHO 7/16: EF 50-5%, RV normal size and systolic function.   ROS: All systems negative except as listed in HPI, PMH and Problem List.  SH:  History   Social History  . Marital Status: Single    Spouse Name: N/A  . Number of Children: N/A  . Years of Education: N/A   Occupational History  . Not on file.   Social History Main Topics  . Smoking status: Light Tobacco Smoker -- 0.30 packs/day for 12 years    Types: Cigarettes  . Smokeless tobacco: Former Systems developer    Quit date: 06/24/2014  . Alcohol Use: 0.0 oz/week    0 Standard drinks or equivalent per week     Comment: rarely  . Drug Use: Not on file  . Sexual Activity: Not on file   Other Topics Concern  . Not on file   Social History Narrative    FH:  Family History  Problem Relation Age of Onset  . CVA Mother     Past Medical History  Diagnosis Date  . Chronic systolic CHF   . Stroke     2010  . HTN (hypertension)   . Tobacco abuse   . Non-ischemic cardiomyopathy     Current Outpatient Prescriptions  Medication Sig Dispense Refill  . acetaminophen (TYLENOL) 325 MG tablet Take 2 tablets (650 mg total) by mouth every  6 (six) hours as needed for mild pain, moderate pain, fever or headache (or Fever >/= 101).    Marland Kitchen atorvastatin (LIPITOR) 20 MG tablet Take 1 tablet (20 mg total) by mouth daily. 90 tablet 3  . carvedilol (COREG) 25 MG tablet Take 1 tablet (25 mg total) by mouth 2 (two) times daily with a meal. 60 tablet 3  . ferrous sulfate (FER-IN-SOL) 75 (15 FE) MG/ML SOLN Take 15 mg of iron by mouth 2 (two) times daily.    . furosemide (LASIX) 40 MG tablet Take 1 tablet (40 mg total) by mouth 2 (two) times daily. 60 tablet 0  . hydrALAZINE (APRESOLINE) 50 MG tablet Take 1 tablet (50 mg total) by mouth 3 (three) times daily. 90 tablet 6  . HYDROcodone-acetaminophen (NORCO/VICODIN) 5-325 MG per tablet Take 2 tablets by mouth every 4 (four) hours as needed. 10 tablet 0  . Ibuprofen (MIDOL) 200 MG CAPS Take 400 mg by mouth every 8 (eight) hours as needed (for pain.).    Marland Kitchen methocarbamol (ROBAXIN) 500 MG tablet Take 1 tablet (500 mg total) by mouth 2 (two) times daily. 20 tablet 0  . naproxen (NAPROSYN) 500 MG tablet Take 1 tablet (500 mg total) by  mouth 2 (two) times daily. 30 tablet 0  . sacubitril-valsartan (ENTRESTO) 49-51 MG Take 1 tablet by mouth 2 (two) times daily. 60 tablet 3  . spironolactone (ALDACTONE) 25 MG tablet Take 1 tablet (25 mg total) by mouth daily. 30 tablet 3  . traMADol (ULTRAM) 50 MG tablet Take 1 tablet (50 mg total) by mouth every 6 (six) hours as needed. 15 tablet 0   No current facility-administered medications for this encounter.    Filed Vitals:   10/30/14 1201  BP: 116/80  Pulse: 82  Weight: 220 lb (99.791 kg)  SpO2: 95%    PHYSICAL EXAM:  General:  Well appearing. No resp difficulty HEENT: normal Neck: supple. Thick. No JVD. Carotids 2+ bilaterally; no bruits. No lymphadenopathy or thryomegaly appreciated. Cor: PMI normal, gegular rate & rhythm. +s4, no murmur.  Lungs: clear Abdomen: obese, soft, nontender, nondistended. No hepatosplenomegaly. No bruits or masses. Good  bowel sounds. Extremities: no cyanosis, clubbing, rash, edema Neuro: alert & orientedx3, cranial nerves grossly intact. Moves all 4 extremities w/o difficulty. Affect pleasant.   ASSESSMENT & PLAN:  1. Chronic Systolic Heart Failure: Initial echo with EF 20-25% with severe LVH (2/16). Nonischemic cardiomyopathy - suspect hypertensive cardiomyopathy. Coronaries normal on cath 2012 in [place].  NYHA class II symptoms, BP better on current regimen.  Compliant with meds currently. I reviewed today's echo.  EF up to 50-55%.  - Volume looks ok. Continue lasix 40 mg twice a day.  - Continue Coreg 25 mg bid, Entresto, spironolactone.  - Continue hydralazine 50 mg three times a day. Unable to tolerate Imdur or bidil due to headaches.   2. HTN: BP better today.  3. Tobacco use: I again encouraged her to quit.   4. OSA: not using CPAP  Followup in 3 months to make sure that BP remains controlled.    Loralie [name] 11/01/2014

## 2014-11-02 ENCOUNTER — Encounter (HOSPITAL_COMMUNITY): Payer: Self-pay | Admitting: Cardiology

## 2014-11-16 ENCOUNTER — Telehealth (HOSPITAL_COMMUNITY): Payer: Self-pay | Admitting: Cardiology

## 2014-11-16 NOTE — Telephone Encounter (Signed)
Can take Lasix 60 mg bid with extra 20 KCl for 2 days then back to 40 bid.

## 2014-11-16 NOTE — Telephone Encounter (Signed)
[name] called during patients home visit Weight increased today to 228 (last office visit 220) LE edema and abdominal edema Increased SOB at rest B/p 154/78 Pt does report to cooking with a lot of salt over the weekend  lasix 40 mg twice a day. Cr 1.17 K 3.9 on 10/30/14  Please advise

## 2014-11-16 NOTE — Telephone Encounter (Signed)
Wallace, Mullen aware of orders and voiced understanding Left message for patient to return call

## 2014-11-17 NOTE — Telephone Encounter (Signed)
Pt called and stated she is already taking Lasix 80mg  bid. Please advise

## 2014-11-17 NOTE — Telephone Encounter (Signed)
Thought message said 40 mg bid.  If she has been taking 80 mg bid long-term, then take Lasix 120 mg bid x 1 day, then back to 80 mg bid.

## 2014-11-17 NOTE — Telephone Encounter (Signed)
Pt aware and voiced understanding Pt reports she is taking 40 mg twice a day for a total of 80 mg daily Advised patient of original instructions ( 80mg  twice a day x 1 day then resume 40 mg BID) Advised pt to give our office a call if swelling/SOB/weight gain dose not resolve

## 2014-11-18 ENCOUNTER — Telehealth (HOSPITAL_COMMUNITY): Payer: Self-pay | Admitting: *Deleted

## 2014-11-18 NOTE — Telephone Encounter (Signed)
Pt was enrolled and accepted into the [employer] Pt Asst Program to receive her Delene Loll for free until 11/08/15

## 2014-12-10 ENCOUNTER — Other Ambulatory Visit (HOSPITAL_COMMUNITY): Payer: Self-pay | Admitting: Adult Health

## 2014-12-29 ENCOUNTER — Other Ambulatory Visit (HOSPITAL_COMMUNITY): Payer: Self-pay | Admitting: Adult Health

## 2015-01-18 ENCOUNTER — Telehealth: Payer: Self-pay | Admitting: Licensed Clinical Social Worker

## 2015-01-18 NOTE — Telephone Encounter (Signed)
CSW received request to contact patient who recently was homeless by Kennedy from [employer]. Patient was previously living with her brother and due to a dispute between them he asked her to leave his home. Her brother paid for a hotel for one week. Patient is currently staying at the [facility] and requesting assistance with permanent housing. CSW discussed options and encouraged patient to follow up at the [facility] for further housing options available in [facility]. Patient verbalizes understanding and will return call to CSW if further needs arise. CSW will discuss outcome with Katie from paramedicine. Raquel Sarna, [place]

## 2015-01-25 ENCOUNTER — Telehealth: Payer: Self-pay | Admitting: Licensed Clinical Social Worker

## 2015-01-25 NOTE — Telephone Encounter (Signed)
CSW left messge for return call to patient. Patient had contacted CSW last week about homelessness issue. She was staying in a motel paid for by her brother for 5 days and had no where else to go after the 5 days. CSW had referred patient to [facility] for evaluation of services. CSW awaiting return call. Raquel Sarna, [place]

## 2015-01-27 ENCOUNTER — Telehealth: Payer: Self-pay | Admitting: Licensed Clinical Social Worker

## 2015-01-27 ENCOUNTER — Telehealth (HOSPITAL_COMMUNITY): Payer: Self-pay

## 2015-01-27 NOTE — Telephone Encounter (Signed)
CSW received return call from patient. She reports that she is still at the same hotel as "someone" paid for another week. She reports she went to [facility] last week but missed her return appointment last Friday. She has an appointment tomorrow at [place] for [facility] and will also return to [facility] on Friday for follow up as she will be homeless as of Friday as no further payment at hotel. Patient states that Katie from paramedicine was able to make a visit yesterday and she will keep in touch post her appointments. CSW available as needed. Raquel Sarna, [place]

## 2015-01-27 NOTE — Telephone Encounter (Signed)
Keep Lasix at 40 mg bid but due for followup appt

## 2015-01-27 NOTE — Telephone Encounter (Signed)
Let [name] and patient know that she is to keep her Lasix 40 mg twice a day and that she needs to make a follow up appointment with Dr. Aundra Dubin

## 2015-01-27 NOTE — Telephone Encounter (Signed)
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 No other symptoms  Is not taking her Lasix as early as she did.  BP 128/88

## 2015-01-30 ENCOUNTER — Encounter (HOSPITAL_COMMUNITY): Payer: Self-pay

## 2015-02-01 MED ORDER — FUROSEMIDE 40 MG PO TABS: 40.0000 mg | ORAL_TABLET | Freq: Two times a day (BID) | ORAL | Status: DC

## 2015-02-01 NOTE — Addendum Note (Signed)
Addended by: Philemon Kingdom D on: 02/01/2015 03:15 PM   Modules accepted: Orders

## 2015-02-17 ENCOUNTER — Encounter (HOSPITAL_COMMUNITY): Payer: Self-pay

## 2015-02-25 ENCOUNTER — Telehealth: Payer: Self-pay | Admitting: Licensed Clinical Social Worker

## 2015-02-25 ENCOUNTER — Other Ambulatory Visit (HOSPITAL_COMMUNITY): Payer: Self-pay | Admitting: *Deleted

## 2015-02-25 MED ORDER — CARVEDILOL 25 MG PO TABS: 25.0000 mg | ORAL_TABLET | Freq: Two times a day (BID) | ORAL | Status: DC

## 2015-02-25 NOTE — Telephone Encounter (Signed)
CSW received call from [place] asking about options for medicaid for patient. CSW explained due to lack of disability status that patient would not be eligible for medicaid. Patient instructed to apply for [facility] card or possible [facility]. Patient currently staying in a shelter and encouraged to seek assistance for community resources through he shelter social worker. Patient has an appointment in the HF clinic next week and CSW will be available to meet with patient if indicated. Paramedicine to continue to follow and will coordinate with CSW as needed. Raquel Sarna, [place]

## 2015-03-04 ENCOUNTER — Encounter (HOSPITAL_COMMUNITY): Payer: Self-pay | Admitting: Cardiology

## 2015-03-04 ENCOUNTER — Encounter: Payer: Self-pay | Admitting: Licensed Clinical Social Worker

## 2015-03-04 ENCOUNTER — Ambulatory Visit (HOSPITAL_COMMUNITY)
Admission: RE | Admit: 2015-03-04 | Discharge: 2015-03-04 | Disposition: A | Discharge status: Home / Self Care | Payer: Self-pay | Source: Ambulatory Visit | Attending: Cardiology | Admitting: Cardiology

## 2015-03-04 VITALS — BP 132/74 | HR 78 | Wt 233.0 lb

## 2015-03-04 DIAGNOSIS — I5022 Chronic systolic (congestive) heart failure: Secondary | ICD-10-CM | POA: Insufficient documentation

## 2015-03-04 DIAGNOSIS — I10 Essential (primary) hypertension: Secondary | ICD-10-CM

## 2015-03-04 DIAGNOSIS — G4733 Obstructive sleep apnea (adult) (pediatric): Secondary | ICD-10-CM | POA: Insufficient documentation

## 2015-03-04 DIAGNOSIS — Z720 Tobacco use: Secondary | ICD-10-CM | POA: Insufficient documentation

## 2015-03-04 DIAGNOSIS — I110 Hypertensive heart disease with heart failure: Secondary | ICD-10-CM | POA: Insufficient documentation

## 2015-03-04 DIAGNOSIS — F17200 Nicotine dependence, unspecified, uncomplicated: Secondary | ICD-10-CM

## 2015-03-04 LAB — BASIC METABOLIC PANEL
Anion gap: 6 (ref 5–15)
BUN: 14 mg/dL (ref 6–20)
CHLORIDE: 105 mmol/L (ref 101–111)
CO2: 25 mmol/L (ref 22–32)
CREATININE: 1.13 mg/dL — AB (ref 0.44–1.00)
Calcium: 9.1 mg/dL (ref 8.9–10.3)
GFR calc non Af Amer: 58 mL/min — ABNORMAL LOW (ref >60)
Glucose, Bld: 96 mg/dL (ref 65–99)
POTASSIUM: 4.1 mmol/L (ref 3.5–5.1)
SODIUM: 136 mmol/L (ref 135–145)

## 2015-03-04 NOTE — Progress Notes (Signed)
Advanced Heart Failure Medication Review by a Pharmacist  Does the patient  feel that his/her medications are working for him/her?  yes  Has the patient been experiencing any side effects to the medications prescribed?  no  Does the patient measure his/her own blood pressure or blood glucose at home?  no   Does the patient have any problems obtaining medications due to transportation or finances?   no  Understanding of regimen: good Understanding of indications: good Potential of compliance: good Patient understands to avoid NSAIDs. Patient understands to avoid decongestants.  Issues to address at subsequent visits: None   Pharmacist comments:  [name] is a pleasant 44 yo F presenting without a medication list but with excellent recall of her regimen including dosages. She reports excellent compliance with her medications. She states that she is in the process of applying for jobs and hopes to obtain prescription insurance soon. She did not have any other medication-related questions or concerns for me at this time.   Ruta Hinds. Velva Harman, PharmD, BCPS, CPP Clinical Pharmacist Pager: [phone] Phone: [phone] 03/04/2015 10:03 AM      Time with patient: 6 minutes Preparation and documentation time: 2 minutes Total time: 8 minutes

## 2015-03-04 NOTE — Progress Notes (Signed)
CSW referred to assist patient with obtaining health insurance. Patient reports she is not working and does not have any income. Patient was encouraged to apply for the [facility] card for healthcare needs. CSW provided information and encouraged patient to also apply for medicaid. CSW provided card for return call after exploring options. CSW will continue to follow and assist with paramedicine program coordination. Raquel Sarna, [place]

## 2015-03-04 NOTE — Progress Notes (Signed)
Patient ID: [name], female   DOB: January 08, 1971, 44 y.o.   MRN: [id number] PCP: None   HPI: [name] is a 44 yo female with history of chronic systolic HF due to nonischemic cardiomyopathy dating back to at least 2009, HTN, CVA 2010, tobacco abuse, Had cath 2011 at [facility] with normal coronaries.    Admitted to [facility] with respiratory distress due to bronchitis in 2/16. She had been off HF meds as well due to noncompliance. These were resumed. Discharge weight was 227 pounds.   She returns for HF follow up. Having difficulty retaining fluid. Has been eating high salt foods. Taking all medications.She denies dyspnea or chest pain.  Not working. Looking for a job. Not weighing daily.   Smoking 1 cigarette per day. Rides bus to appointments.   Labs 06/12/2014 K 4.1 Creatinine 1.42  Labs 06/25/2014: 4.4 Creatinine 1.29  Labs 10/30/2014: K 3.9 Creatinine 1.17   ECHO 06/10/2014: EF 20-25%, severe LVH, severe LAE. RV ok.  ECHO 7/16: EF 50-5%, RV normal size and systolic function. Grade I DD  ROS: All systems negative except as listed in HPI, PMH and Problem List.  SH:  Social History   Social History  . Marital Status: Single    Spouse Name: N/A  . Number of Children: N/A  . Years of Education: N/A   Occupational History  . Not on file.   Social History Main Topics  . Smoking status: Light Tobacco Smoker -- 0.30 packs/day for 12 years    Types: Cigarettes  . Smokeless tobacco: Former Systems developer    Quit date: 06/24/2014  . Alcohol Use: 0.0 oz/week    0 Standard drinks or equivalent per week     Comment: rarely  . Drug Use: Not on file  . Sexual Activity: Not on file   Other Topics Concern  . Not on file   Social History Narrative    FH:  Family History  Problem Relation Age of Onset  . CVA Mother     Past Medical History  Diagnosis Date  . Chronic systolic CHF   . Stroke     2010  . HTN (hypertension)   . Tobacco abuse   . Non-ischemic cardiomyopathy [facility])     Current  Outpatient Prescriptions  Medication Sig Dispense Refill  . acetaminophen (TYLENOL) 325 MG tablet Take 2 tablets (650 mg total) by mouth every 6 (six) hours as needed for mild pain, moderate pain, fever or headache (or Fever >/= 101).    Marland Kitchen atorvastatin (LIPITOR) 20 MG tablet TAKE 1 TABLET BY MOUTH DAILY 30 tablet 2  . carvedilol (COREG) 25 MG tablet Take 1 tablet (25 mg total) by mouth 2 (two) times daily with a meal. 60 tablet 3  . furosemide (LASIX) 40 MG tablet Take 1 tablet (40 mg total) by mouth 2 (two) times daily. 60 tablet 0  . hydrALAZINE (APRESOLINE) 50 MG tablet TAKE 1 TABLET BY MOUTH 3 TIMES DAILY 102 tablet 2  . sacubitril-valsartan (ENTRESTO) 49-51 MG Take 1 tablet by mouth 2 (two) times daily. 60 tablet 3  . spironolactone (ALDACTONE) 25 MG tablet TAKE 1 TABLET BY MOUTH DAILY 30 tablet 2  . traMADol (ULTRAM) 50 MG tablet Take 50 mg by mouth every 6 (six) hours as needed for moderate pain.    . Ibuprofen (MIDOL) 200 MG CAPS Take 400 mg by mouth every 8 (eight) hours as needed (for pain.).     No current facility-administered medications for this encounter.  Filed Vitals:   03/04/15 1000  BP: 132/74  Pulse: 78  Weight: 233 lb (105.688 kg)  SpO2: 95%    PHYSICAL EXAM:  General:  Well appearing. No resp difficulty HEENT: normal Neck: supple. Thick. No JVD. Carotids 2+ bilaterally; no bruits. No lymphadenopathy or thryomegaly appreciated. Cor: PMI normal, gegular rate & rhythm.. no murmur.  Lungs: clear Abdomen: obese, soft, nontender, nondistended. No hepatosplenomegaly. No bruits or masses. Good bowel sounds. Extremities: no cyanosis, clubbing, rash, edema Neuro: alert & orientedx3, cranial nerves grossly intact. Moves all 4 extremities w/o difficulty. Affect pleasant.   ASSESSMENT & PLAN: 1. Chronic Systolic Heart Failure: Initial echo with EF 20-25% with severe LVH (2/16). Nonischemic cardiomyopathy - suspect hypertensive cardiomyopathy. Coronaries normal on cath  2012 in [place].  Most recent ECHO EF recovered 50-55%.  NYHA class I symptoms,  Compliant with meds currently. - Volume stable despite weight gain.  Continue lasix 40 mg twice a day. Asked her cut back salt intake because she has been eating high salt foods.  - Continue Coreg 25 mg bid, Entresto 49-51 mg twice a day , spironolactone.  - Continue hydralazine 50 mg three times a day. Unable to tolerate Imdur or bidil due to headaches.   Encouraged to increase activity. She is not sexually active and understands she needs avoid pregnancy with HF meds.  Check BMET today.  2. HTN: Continue current regimen.   3. Tobacco use: Encouraged to stop.  4. OSA: not using CPAP  Check BMET . Referred to HF SW for assistance with insurance and PCP.  Follow up in 4 months    Amy Clegg NP-C  03/04/2015

## 2015-03-04 NOTE — Patient Instructions (Signed)
Labs today  Your physician recommends that you schedule a follow-up appointment in: 4 months with the NP clinic  Do the following things EVERYDAY: 1) Weigh yourself in the morning before breakfast. Write it down and keep it in a log. 2) Take your medicines as prescribed 3) Eat low salt foods-Limit salt (sodium) to 2000 mg per day.  4) Stay as active as you can everyday 5) Limit all fluids for the day to less than 2 liters 6)

## 2015-03-15 ENCOUNTER — Other Ambulatory Visit (HOSPITAL_COMMUNITY): Payer: Self-pay | Admitting: Cardiology

## 2015-03-19 ENCOUNTER — Encounter (HOSPITAL_COMMUNITY): Payer: Self-pay

## 2015-04-21 ENCOUNTER — Other Ambulatory Visit (HOSPITAL_COMMUNITY): Payer: Self-pay | Admitting: Adult Health

## 2015-04-29 MED FILL — ATORVASTATIN 20 MG TABLET: 20 | 30 days supply | Qty: 30 | Fill #0 | Status: TO

## 2015-07-01 ENCOUNTER — Encounter (HOSPITAL_COMMUNITY): Payer: Self-pay

## 2015-10-04 ENCOUNTER — Other Ambulatory Visit (HOSPITAL_COMMUNITY): Payer: Self-pay | Admitting: *Deleted

## 2015-10-06 ENCOUNTER — Other Ambulatory Visit (HOSPITAL_COMMUNITY): Payer: Self-pay | Admitting: *Deleted

## 2015-10-06 MED ORDER — SACUBITRIL-VALSARTAN 49-51 MG PO TABS: 1.0000 | ORAL_TABLET | Freq: Two times a day (BID) | ORAL | Status: DC

## 2015-10-07 ENCOUNTER — Other Ambulatory Visit (HOSPITAL_COMMUNITY): Payer: Self-pay | Admitting: *Deleted

## 2015-10-27 ENCOUNTER — Other Ambulatory Visit (HOSPITAL_COMMUNITY): Payer: Self-pay | Admitting: *Deleted

## 2015-10-27 ENCOUNTER — Telehealth (HOSPITAL_COMMUNITY): Payer: Self-pay | Admitting: Vascular Surgery

## 2015-10-27 MED ORDER — SACUBITRIL-VALSARTAN 49-51 MG PO TABS: 1.0000 | ORAL_TABLET | Freq: Two times a day (BID) | ORAL | Status: DC

## 2015-10-27 NOTE — Telephone Encounter (Signed)
Pt needs refill [facility].. Faxed in to [facility]

## 2015-10-27 NOTE — Telephone Encounter (Signed)
Faxed in new Rx to [facility] but enrollment ends at the end of July. Have left VM with patient that she will need to re-apply for further assistance after this month.   Ruta Hinds. Velva Harman, PharmD, BCPS, CPP Clinical Pharmacist Pager: [phone] Phone: [phone] 10/27/2015 11:56 AM

## 2015-12-06 ENCOUNTER — Encounter (HOSPITAL_COMMUNITY): Payer: Self-pay

## 2016-01-07 ENCOUNTER — Encounter (HOSPITAL_COMMUNITY): Payer: Self-pay

## 2016-05-30 ENCOUNTER — Encounter (HOSPITAL_COMMUNITY): Payer: Self-pay

## 2016-06-07 ENCOUNTER — Encounter (HOSPITAL_COMMUNITY): Payer: Self-pay

## 2016-06-14 ENCOUNTER — Inpatient Hospital Stay (HOSPITAL_COMMUNITY): Admission: RE | Admit: 2016-06-14 | Payer: Self-pay | Source: Ambulatory Visit

## 2016-07-01 DIAGNOSIS — N183 Chronic kidney disease, stage 3 (moderate): Secondary | ICD-10-CM | POA: Diagnosis present

## 2016-07-01 DIAGNOSIS — R791 Abnormal coagulation profile: Secondary | ICD-10-CM | POA: Diagnosis present

## 2016-07-01 DIAGNOSIS — F17210 Nicotine dependence, cigarettes, uncomplicated: Secondary | ICD-10-CM | POA: Diagnosis present

## 2016-07-01 DIAGNOSIS — F419 Anxiety disorder, unspecified: Secondary | ICD-10-CM | POA: Diagnosis present

## 2016-07-01 DIAGNOSIS — N179 Acute kidney failure, unspecified: Secondary | ICD-10-CM | POA: Diagnosis present

## 2016-07-01 DIAGNOSIS — I248 Other forms of acute ischemic heart disease: Secondary | ICD-10-CM | POA: Diagnosis present

## 2016-07-01 DIAGNOSIS — Z882 Allergy status to sulfonamides status: Secondary | ICD-10-CM

## 2016-07-01 DIAGNOSIS — Z823 Family history of stroke: Secondary | ICD-10-CM

## 2016-07-01 DIAGNOSIS — I5043 Acute on chronic combined systolic (congestive) and diastolic (congestive) heart failure: Secondary | ICD-10-CM | POA: Diagnosis present

## 2016-07-01 DIAGNOSIS — G4733 Obstructive sleep apnea (adult) (pediatric): Secondary | ICD-10-CM | POA: Diagnosis present

## 2016-07-01 DIAGNOSIS — Z79899 Other long term (current) drug therapy: Secondary | ICD-10-CM

## 2016-07-01 DIAGNOSIS — Z8673 Personal history of transient ischemic attack (TIA), and cerebral infarction without residual deficits: Secondary | ICD-10-CM

## 2016-07-01 DIAGNOSIS — Z9119 Patient's noncompliance with other medical treatment and regimen: Secondary | ICD-10-CM

## 2016-07-01 DIAGNOSIS — I429 Cardiomyopathy, unspecified: Principal | ICD-10-CM | POA: Diagnosis present

## 2016-07-01 DIAGNOSIS — I130 Hypertensive heart and chronic kidney disease with heart failure and stage 1 through stage 4 chronic kidney disease, or unspecified chronic kidney disease: Secondary | ICD-10-CM | POA: Diagnosis present

## 2016-07-01 DIAGNOSIS — E785 Hyperlipidemia, unspecified: Secondary | ICD-10-CM | POA: Diagnosis present

## 2016-07-02 ENCOUNTER — Inpatient Hospital Stay (HOSPITAL_COMMUNITY)
Admission: EM | Admit: 2016-07-02 | Discharge: 2016-07-04 | DRG: 314 | Disposition: A | Discharge status: Home / Self Care | Payer: Self-pay | Attending: Internal Medicine | Admitting: Internal Medicine

## 2016-07-02 ENCOUNTER — Encounter (HOSPITAL_COMMUNITY): Payer: Self-pay

## 2016-07-02 ENCOUNTER — Emergency Department (HOSPITAL_COMMUNITY): Payer: Self-pay

## 2016-07-02 DIAGNOSIS — I5043 Acute on chronic combined systolic (congestive) and diastolic (congestive) heart failure: Secondary | ICD-10-CM | POA: Diagnosis present

## 2016-07-02 DIAGNOSIS — I509 Heart failure, unspecified: Secondary | ICD-10-CM | POA: Insufficient documentation

## 2016-07-02 DIAGNOSIS — N179 Acute kidney failure, unspecified: Secondary | ICD-10-CM | POA: Diagnosis present

## 2016-07-02 DIAGNOSIS — R600 Localized edema: Secondary | ICD-10-CM | POA: Diagnosis present

## 2016-07-02 DIAGNOSIS — R778 Other specified abnormalities of plasma proteins: Secondary | ICD-10-CM | POA: Diagnosis present

## 2016-07-02 DIAGNOSIS — R748 Abnormal levels of other serum enzymes: Secondary | ICD-10-CM

## 2016-07-02 DIAGNOSIS — N183 Chronic kidney disease, stage 3 unspecified: Secondary | ICD-10-CM | POA: Diagnosis present

## 2016-07-02 DIAGNOSIS — R109 Unspecified abdominal pain: Secondary | ICD-10-CM | POA: Diagnosis present

## 2016-07-02 DIAGNOSIS — F17200 Nicotine dependence, unspecified, uncomplicated: Secondary | ICD-10-CM | POA: Diagnosis present

## 2016-07-02 DIAGNOSIS — R7989 Other specified abnormal findings of blood chemistry: Secondary | ICD-10-CM | POA: Diagnosis present

## 2016-07-02 DIAGNOSIS — I5023 Acute on chronic systolic (congestive) heart failure: Secondary | ICD-10-CM

## 2016-07-02 DIAGNOSIS — R0603 Acute respiratory distress: Secondary | ICD-10-CM

## 2016-07-02 DIAGNOSIS — I10 Essential (primary) hypertension: Secondary | ICD-10-CM | POA: Diagnosis present

## 2016-07-02 LAB — COMPREHENSIVE METABOLIC PANEL
ALT: 23 U/L (ref 14–54)
ANION GAP: 11 (ref 5–15)
AST: 30 U/L (ref 15–41)
Albumin: 3.7 g/dL (ref 3.5–5.0)
Alkaline Phosphatase: 67 U/L (ref 38–126)
BUN: 19 mg/dL (ref 6–20)
CHLORIDE: 101 mmol/L (ref 101–111)
CO2: 27 mmol/L (ref 22–32)
Calcium: 9.3 mg/dL (ref 8.9–10.3)
Creatinine, Ser: 1.44 mg/dL — ABNORMAL HIGH (ref 0.44–1.00)
GFR calc non Af Amer: 43 mL/min — ABNORMAL LOW (ref >60)
GFR, EST AFRICAN AMERICAN: 50 mL/min — AB (ref >60)
Glucose, Bld: 114 mg/dL — ABNORMAL HIGH (ref 65–99)
POTASSIUM: 3.7 mmol/L (ref 3.5–5.1)
SODIUM: 139 mmol/L (ref 135–145)
Total Bilirubin: 0.8 mg/dL (ref 0.3–1.2)
Total Protein: 6.4 g/dL — ABNORMAL LOW (ref 6.5–8.1)

## 2016-07-02 LAB — BASIC METABOLIC PANEL
Anion gap: 8 (ref 5–15)
BUN: 18 mg/dL (ref 6–20)
CHLORIDE: 104 mmol/L (ref 101–111)
CO2: 28 mmol/L (ref 22–32)
Calcium: 9 mg/dL (ref 8.9–10.3)
Creatinine, Ser: 1.31 mg/dL — ABNORMAL HIGH (ref 0.44–1.00)
GFR calc Af Amer: 56 mL/min — ABNORMAL LOW (ref >60)
GFR calc non Af Amer: 48 mL/min — ABNORMAL LOW (ref >60)
GLUCOSE: 108 mg/dL — AB (ref 65–99)
POTASSIUM: 3.3 mmol/L — AB (ref 3.5–5.1)
SODIUM: 140 mmol/L (ref 135–145)

## 2016-07-02 LAB — CBC
HEMATOCRIT: 39 % (ref 36.0–46.0)
HEMOGLOBIN: 12.3 g/dL (ref 12.0–15.0)
MCH: 27.3 pg (ref 26.0–34.0)
MCHC: 31.5 g/dL (ref 30.0–36.0)
MCV: 86.7 fL (ref 78.0–100.0)
Platelets: 235 10*3/uL (ref 150–400)
RBC: 4.5 MIL/uL (ref 3.87–5.11)
RDW: 17.4 % — ABNORMAL HIGH (ref 11.5–15.5)
WBC: 7.7 10*3/uL (ref 4.0–10.5)

## 2016-07-02 LAB — URINALYSIS, ROUTINE W REFLEX MICROSCOPIC
BILIRUBIN URINE: NEGATIVE
Bacteria, UA: NONE SEEN
Glucose, UA: NEGATIVE mg/dL
Hgb urine dipstick: NEGATIVE
Ketones, ur: NEGATIVE mg/dL
LEUKOCYTES UA: NEGATIVE
Nitrite: NEGATIVE
Protein, ur: >=300 mg/dL — AB
Specific Gravity, Urine: 1.023 (ref 1.005–1.030)
pH: 5 (ref 5.0–8.0)

## 2016-07-02 LAB — LIPASE, BLOOD: LIPASE: 13 U/L (ref 11–51)

## 2016-07-02 LAB — D-DIMER, QUANTITATIVE: D-Dimer, Quant: 0.73 ug/mL-FEU — ABNORMAL HIGH (ref 0.00–0.50)

## 2016-07-02 LAB — TROPONIN I
TROPONIN I: 0.11 ng/mL — AB (ref <0.03)
TROPONIN I: 0.1 ng/mL — AB (ref <0.03)

## 2016-07-02 LAB — BRAIN NATRIURETIC PEPTIDE: B Natriuretic Peptide: 1907.5 pg/mL — ABNORMAL HIGH (ref 0.0–100.0)

## 2016-07-02 LAB — MRSA PCR SCREENING: MRSA by PCR: NEGATIVE

## 2016-07-02 MED ORDER — SODIUM CHLORIDE 0.9 % IV SOLN: 250.0000 mL | INTRAVENOUS | Status: DC | PRN

## 2016-07-02 MED ORDER — FUROSEMIDE 10 MG/ML IJ SOLN
40.0000 mg | Freq: Once | INTRAMUSCULAR | Status: AC
Start: 2016-07-02 — End: 2016-07-02
  Administered 2016-07-02: 40 mg via INTRAVENOUS
  Filled 2016-07-02: qty 4

## 2016-07-02 MED ORDER — SODIUM CHLORIDE 0.9% FLUSH
3.0000 mL | Freq: Two times a day (BID) | INTRAVENOUS | Status: DC
  Administered 2016-07-02 – 2016-07-03 (×5): 3 mL via INTRAVENOUS

## 2016-07-02 MED ORDER — FUROSEMIDE 10 MG/ML IJ SOLN
40.0000 mg | Freq: Two times a day (BID) | INTRAMUSCULAR | Status: DC
  Administered 2016-07-02 – 2016-07-04 (×5): 40 mg via INTRAVENOUS
  Filled 2016-07-02 (×5): qty 4

## 2016-07-02 MED ORDER — LORAZEPAM 2 MG/ML IJ SOLN
0.5000 mg | Freq: Once | INTRAMUSCULAR | Status: AC
  Administered 2016-07-02: 0.5 mg via INTRAVENOUS
  Filled 2016-07-02: qty 1

## 2016-07-02 MED ORDER — SPIRONOLACTONE 25 MG PO TABS
25.0000 mg | ORAL_TABLET | Freq: Every day | ORAL | Status: DC
Start: 2016-07-02 — End: 2016-07-04
  Administered 2016-07-02 – 2016-07-04 (×3): 25 mg via ORAL
  Filled 2016-07-02 (×3): qty 1

## 2016-07-02 MED ORDER — SODIUM CHLORIDE 0.9% FLUSH: 3.0000 mL | INTRAVENOUS | Status: DC | PRN

## 2016-07-02 MED ORDER — HYDRALAZINE HCL 20 MG/ML IJ SOLN: 10.0000 mg | INTRAMUSCULAR | Status: DC | PRN

## 2016-07-02 MED ORDER — IPRATROPIUM-ALBUTEROL 0.5-2.5 (3) MG/3ML IN SOLN: 3.0000 mL | RESPIRATORY_TRACT | Status: DC | PRN

## 2016-07-02 MED ORDER — FUROSEMIDE 10 MG/ML IJ SOLN
INTRAMUSCULAR | Status: AC
  Filled 2016-07-02: qty 4

## 2016-07-02 MED ORDER — ATORVASTATIN CALCIUM 20 MG PO TABS
20.0000 mg | ORAL_TABLET | Freq: Every day | ORAL | Status: DC
  Administered 2016-07-02 – 2016-07-04 (×3): 20 mg via ORAL
  Filled 2016-07-02 (×3): qty 1

## 2016-07-02 MED ORDER — METOPROLOL TARTRATE 5 MG/5ML IV SOLN
INTRAVENOUS | Status: AC
  Administered 2016-07-02: 5 mg via INTRAVENOUS
  Filled 2016-07-02: qty 10

## 2016-07-02 MED ORDER — ACETAMINOPHEN 325 MG PO TABS: 650.0000 mg | ORAL_TABLET | Freq: Four times a day (QID) | ORAL | Status: DC | PRN

## 2016-07-02 MED ORDER — ACETAMINOPHEN 325 MG PO TABS
650.0000 mg | ORAL_TABLET | ORAL | Status: DC | PRN
  Administered 2016-07-02 – 2016-07-03 (×3): 650 mg via ORAL
  Filled 2016-07-02 (×3): qty 2

## 2016-07-02 MED ORDER — TRAMADOL HCL 50 MG PO TABS: 50.0000 mg | ORAL_TABLET | Freq: Four times a day (QID) | ORAL | Status: DC | PRN

## 2016-07-02 MED ORDER — HYDRALAZINE HCL 25 MG PO TABS
50.0000 mg | ORAL_TABLET | Freq: Three times a day (TID) | ORAL | Status: DC
  Administered 2016-07-02 – 2016-07-03 (×4): 50 mg via ORAL
  Filled 2016-07-02 (×4): qty 2

## 2016-07-02 MED ORDER — ENOXAPARIN SODIUM 120 MG/0.8ML ~~LOC~~ SOLN
1.0000 mg/kg | Freq: Two times a day (BID) | SUBCUTANEOUS | Status: DC
  Administered 2016-07-02 – 2016-07-03 (×3): 105 mg via SUBCUTANEOUS
  Filled 2016-07-02: qty 0.8
  Filled 2016-07-02 (×2): qty 0.7

## 2016-07-02 MED ORDER — METOPROLOL TARTRATE 5 MG/5ML IV SOLN
5.0000 mg | INTRAVENOUS | Status: DC | PRN
  Administered 2016-07-02: 5 mg via INTRAVENOUS

## 2016-07-02 MED ORDER — CARVEDILOL 12.5 MG PO TABS
25.0000 mg | ORAL_TABLET | Freq: Two times a day (BID) | ORAL | Status: DC
  Administered 2016-07-02 – 2016-07-04 (×5): 25 mg via ORAL
  Filled 2016-07-02 (×5): qty 2

## 2016-07-02 MED ORDER — ISOSORBIDE MONONITRATE ER 60 MG PO TB24
60.0000 mg | ORAL_TABLET | Freq: Every day | ORAL | Status: DC
  Filled 2016-07-02 (×3): qty 1

## 2016-07-02 MED ORDER — ONDANSETRON HCL 4 MG/2ML IJ SOLN: 4.0000 mg | Freq: Four times a day (QID) | INTRAMUSCULAR | Status: DC | PRN

## 2016-07-02 MED ORDER — ENOXAPARIN SODIUM 40 MG/0.4ML ~~LOC~~ SOLN: 40.0000 mg | SUBCUTANEOUS | Status: DC

## 2016-07-02 NOTE — H&P (Addendum)
History and Physical    [name] WNI:[id number] DOB: 08/16/1970 DOA: 07/02/2016  Referring MD/NP/PA: Dr. Kathrynn Humble PCP: Pcp Not In System  Patient coming from: Home  Chief Complaint: Shortness of breath   HPI: [name] is a 46 y.o. female with medical history significant of HTN, NICM, sCHF last EF 25-30% in 04/2016; who presents with complaints of progressively worsening shortness of breath or last week. Patient was just recently admitted at [facility] from 2/21-2/25 for congestive heart failure exacerbation. She reports following up with her primary care provider as advised as well as taking medications as prescribed. She was initially discharged home on oxygen. When she follow-up with her PCP approximately 10 days ago she was told that she did not need to continue the oxygen. Review of records shows that her weight was approximately 224 pounds.  However, since that time she has noted progressive worsening swelling and abdominal distention and discomfort.  Describes abdominal discomfort as a burning sensation. Of note she reports that her left leg was swollen more than her right which does not usually happen and was warm to the touch at one point. Associated symptoms include orthopnea, insomnia, chest pressure, dry cough, anxiousness, and nausea. Denies vomiting, diarrhea, loss consciousness, dysuria, or focal weakness. Patient notes that she recently moved to [facility] and had previously been being seen by Dr. Benjamine Mola of cardiology.   ED Course: On admission into the emergency department patient was seen to be afebrile, pulse 117-120, respirations 24- 29, blood pressure up to 179/117, and O2 saturations in the lower 90s on room air. Lab work revealed creatinine of 1.44, troponin I 0.11, BNP 1907.5. Urinalysis was negative. Chest x-ray appeared unchanged from 2/21 with cardiomegaly and bibasilar opacities. Patient was given 40 mg of Lasix IV in the ED. Due to the tachypnea and  question of possible fatigue patient was recommended to be started on BiPAP.  Review of Systems: As per HPI otherwise 10 point review of systems negative.   Past Medical History:  Diagnosis Date  . Chronic systolic CHF   . HTN (hypertension)   . Non-ischemic cardiomyopathy ([place])   . Stroke ([place])    2010  . Tobacco abuse     Past Surgical History:  Procedure Laterality Date  . CARDIAC CATHETERIZATION     2012  . CAROTID STENT INSERTION     2010  . CESAREAN SECTION       reports that she has been smoking Cigarettes.  She has a 3.60 pack-year smoking history. She quit smokeless tobacco use about 2 years ago. She reports that she drinks alcohol. Her drug history is not on file.  Allergies  Allergen Reactions  . Sulfonamide Derivatives Hives    Family History  Problem Relation Age of Onset  . CVA Mother     Prior to Admission medications   Medication Sig Start Date End Date Taking? Authorizing Provider  acetaminophen (TYLENOL) 325 MG tablet Take 2 tablets (650 mg total) by mouth every 6 (six) hours as needed for mild pain, moderate pain, fever or headache (or Fever >/= 101). 06/12/14  Yes Modena Jansky, MD  albuterol (PROVENTIL) (2.5 MG/3ML) 0.083% nebulizer solution Take 2.5 mg by nebulization every 6 (six) hours as needed for wheezing or shortness of breath.   Yes Historical Provider, MD  atorvastatin (LIPITOR) 20 MG tablet TAKE 1 TABLET BY MOUTH ONCE DAILY Patient taking differently: TAKE 2 TABLET BY MOUTH ONCE DAILY 04/22/15  Yes Amy Estrella Deeds, NP  carvedilol (COREG) 25 MG tablet Take 1 tablet (25 mg total) by mouth 2 (two) times daily with a meal. 02/25/15  Yes Amy D Clegg, NP  furosemide (LASIX) 40 MG tablet TAKE 1 TABLET BY MOUTH TWICE DAILY 03/15/15  Yes Larey Dresser, MD  hydrALAZINE (APRESOLINE) 50 MG tablet TAKE 1 TABLET BY MOUTH 3 TIMES DAILY 04/22/15  Yes Amy D Clegg, NP  Ibuprofen (MIDOL) 200 MG CAPS Take 400 mg by mouth every 8 (eight) hours as needed (for pain.).    Yes Historical Provider, MD  spironolactone (ALDACTONE) 25 MG tablet TAKE 1 TABLET BY MOUTH ONCE DAILY 04/22/15  Yes Amy D Clegg, NP  traMADol (ULTRAM) 50 MG tablet Take 50 mg by mouth every 6 (six) hours as needed for moderate pain.   Yes Historical Provider, MD    Physical Exam: Constitutional: Obese female who appears to be in moderate respiratory distress  Vitals:   07/02/16 0005 07/02/16 0145 07/02/16 0244 07/02/16 0255  BP: (!) 179/117 105/85 (!) 164/121   Pulse: (!) 120 (!) 117 (!) 119   Resp: (!) 24 (!) 26 (!) 29   Temp: 97.3 F (36.3 C)  98.7 F (37.1 C)   TempSrc: Oral  Oral   SpO2: 93% 94% 94%   Weight:    105.3 kg (232 lb 2.3 oz)   Eyes: PERRL, lids and conjunctivae normal ENMT: Mucous membranes are dry. Posterior pharynx clear of any exudate or lesions.Normal dentition.  Neck: normal, supple, no masses, no thyromegaly+ JVD Respiratory: Tachypneic with decreased overall aeration noted. Patient talking shortened incomplete sentences. Cardiovascular:Tachycardic with positive systolic ejection murmur. No rubs / gallops.2+ pitting lower extremity edema. 2+ pedal pulses. No carotid bruits.  Abdomen: Generalized tenderness, no masses palpated. No hepatosplenomegaly. Bowel sounds positive.  Musculoskeletal: no clubbing / cyanosis. No joint deformity upper and lower extremities. Good ROM, no contractures. Normal muscle tone.  Skin: no rashes, lesions, ulcers. No induration Neurologic: CN 2-12 grossly intact. Sensation intact, DTR normal. Strength 5/5 in all 4.  Psychiatric: Normal judgment and insight. Alert and oriented x 3. Anxious mood.     Labs on Admission: I have personally reviewed following labs and imaging studies  CBC:  Recent Labs Lab 07/02/16 0009  WBC 7.7  HGB 12.3  HCT 39.0  MCV 86.7  PLT 026   Basic Metabolic Panel:  Recent Labs Lab 07/02/16 0009  NA 139  K 3.7  CL 101  CO2 27  GLUCOSE 114*  BUN 19  CREATININE 1.44*  CALCIUM 9.3    GFR: CrCl cannot be calculated (Unknown ideal weight.). Liver Function Tests:  Recent Labs Lab 07/02/16 0009  AST 30  ALT 23  ALKPHOS 67  BILITOT 0.8  PROT 6.4*  ALBUMIN 3.7    Recent Labs Lab 07/02/16 0009  LIPASE 13   No results for input(s): AMMONIA in the last 168 hours. Coagulation Profile: No results for input(s): INR, PROTIME in the last 168 hours. Cardiac Enzymes:  Recent Labs Lab 07/02/16 0109  TROPONINI 0.11*   BNP (last 3 results) No results for input(s): PROBNP in the last 8760 hours. HbA1C: No results for input(s): HGBA1C in the last 72 hours. CBG: No results for input(s): GLUCAP in the last 168 hours. Lipid Profile: No results for input(s): CHOL, HDL, LDLCALC, TRIG, CHOLHDL, LDLDIRECT in the last 72 hours. Thyroid Function Tests: No results for input(s): TSH, T4TOTAL, FREET4, T3FREE, THYROIDAB in the last 72 hours. Anemia Panel: No results for input(s): VITAMINB12, FOLATE, FERRITIN, TIBC,  IRON, RETICCTPCT in the last 72 hours. Urine analysis:    Component Value Date/Time   COLORURINE YELLOW 07/02/2016 0014   APPEARANCEUR CLEAR 07/02/2016 0014   LABSPEC 1.023 07/02/2016 0014   PHURINE 5.0 07/02/2016 0014   GLUCOSEU NEGATIVE 07/02/2016 0014   HGBUR NEGATIVE 07/02/2016 0014   HGBUR trace-intact 03/15/2010 0915   BILIRUBINUR NEGATIVE 07/02/2016 0014   KETONESUR NEGATIVE 07/02/2016 0014   PROTEINUR >=300 (A) 07/02/2016 0014   UROBILINOGEN 0.2 10/14/2014 0112   NITRITE NEGATIVE 07/02/2016 0014   LEUKOCYTESUR NEGATIVE 07/02/2016 0014   Sepsis Labs: No results found for this or any previous visit (from the past 240 hour(s)).   Radiological Exams on Admission: Dg Chest Port 1 View  Result Date: 07/02/2016 CLINICAL DATA:  Shortness of breath EXAM: PORTABLE CHEST 1 VIEW COMPARISON:  Chest radiograph 06/07/2016 FINDINGS: The cardiac silhouette remains massively enlarged. Bibasilar opacities are unchanged. No osseous abnormality. IMPRESSION:  Unchanged massive cardiomegaly and bibasilar opacities. Electronically Signed   By: Ulyses Jarred M.D.   On: 07/02/2016 02:57    EKG: Independently reviewed:Sinus tachycardia with signs of biventricular enlargement   Assessment/Plan Acute on chronic exacerbation of systolic congestive heart failure: Patient presents with shortness of breath BNP 1907.5 just recently admitted at [facility] on 06/07/2016 where echocardiogram done on 05/12/2016 showed mildly dilated left ventricle with severe global hypokinesis with ejection fraction of 25-30%.  - Admit to stepdown bed - Heart failure orders set  initiated  - Strict I&Os and daily weights - Elevate lower extremities - Lasix 40 mg IV Bid - Reassess in a.m. and adjust diuresis as needed. - Optimize medical management   - Will need consultation to cardiology in a.m.   Acute respiratory distress: Patient with significant respiratory distress on admission for which she was placed on BiPAP. - Continuous pulse oximetry with nasal cannula oxygen as needed to keep O2 saturations >92% - BiPAP prn  Elevated troponin: suspect secondary to demand ischemia type II, NSTEMI - Trend troponins  Abdominal pain: Suspect secondary to fluid and ascites. - Continue to monitor  Bilateral lower extremity edema: Acutely worsen. Patient notes left leg swelling more than right. - add-on D-dimer - Vascular duplex Doppler ultrasound of lower extremities    Suspected Acute kidney injury on chronic kidney disease stage II: Baseline creatinine previously around 1.3-1.7, but patient presents with creatinine of 1.44. Suspect related with CHF exacerbation. - Follow-up repeat BMP with diuresis  Accelerated hypertension: Initial blood pressure is elevated up to 179/117 - Continue hydralazine, spironolactone, Coreg  - Hydralazine IV prn  Hyperlipidemia  - Continue atorvastatin   Tobacco abuse - Nicotine patch offered DVT prophylaxis: lovenox for VTE  treatment Code Status: Full  Family Communication: As plan of care with the patient and family members present at bedside Disposition Plan: Likely discharge home once medically stable Consults called: none Admission status: Inpatient  Norval Morton MD [facility] Pager [phone]  If 7PM-7AM, please contact night-coverage [email or web address] Password [email or web address]  07/02/2016, 3:09 AM

## 2016-07-02 NOTE — ED Notes (Signed)
Pt was moving around while bp was being taken

## 2016-07-02 NOTE — Progress Notes (Signed)
Notified [name] Form about the patient refusing to take new imdur after education has been done. No new orders placed at this time. I will continue to monitor the patient closely.   Saddie Benders RN

## 2016-07-02 NOTE — ED Notes (Signed)
Attempted to call report and secretary advised the charge nurse hasn't assigned the room. Advised staff will call back in 10 mins.

## 2016-07-02 NOTE — Progress Notes (Signed)
Patient placed on BIPAP by RT. Patient is tolerating well. Will continue to monitor.

## 2016-07-02 NOTE — Progress Notes (Signed)
ANTICOAGULATION CONSULT NOTE - Initial Consult  Pharmacy Consult for Lovenox  Indication: Rule out DVT  Allergies  Allergen Reactions  . Sulfonamide Derivatives Hives   Patient Measurements: Weight: 232 lb 2.3 oz (105.3 kg)  Vital Signs: Temp: 98.7 F (37.1 C) (03/18 0244) Temp Source: Oral (03/18 0244) BP: 164/121 (03/18 0244) Pulse Rate: 119 (03/18 0244)  Labs:  Recent Labs  07/02/16 0009 07/02/16 0109  HGB 12.3  --   HCT 39.0  --   PLT 235  --   CREATININE 1.44*  --   TROPONINI  --  0.11*    CrCl cannot be calculated (Unknown ideal weight.).   Medical History: Past Medical History:  Diagnosis Date  . Chronic systolic CHF   . HTN (hypertension)   . Non-ischemic cardiomyopathy ([place])   . Stroke ([place])    2010  . Tobacco abuse    Assessment: 46 y/o F here with abdominal pain/shortness of breath, starting Lovenox as patient has some lower extremity swelling and MD would like to rule of DVT, CBC good, mild bump in Scr, PTA meds reviewed.   Goal of Therapy:  Monitor platelets by anticoagulation protocol: Yes   Plan:  -Lovenox 1 mg/kg subcutaneous q12h -Daily CBC -Monitor for bleeding -F/U DVT work-up  Narda Bonds 07/02/2016,3:55 AM

## 2016-07-02 NOTE — Consult Note (Signed)
CARDIOLOGY CONSULT NOTE   Patient ID: [name] MRN: [id number] DOB/AGE: 05-23-1970 46 y.o.  Admit date: 07/02/2016  Requesting Physician: Dr. Candiss Norse Primary Physician:   Pcp Not In System Primary Cardiologist: Dr. Clydie Braun (in [facility]), but has seen Dr. Aundra Dubin in [facility] Reason for Consultation:  CHF  HPI: [name] is a 46 y.o. female with a history of chronic systolic CHF (NICM since at least 2009), HTN, CVA (right sided MCA territory infarct in 2010), tobacco abuse, CKD, medical compliance and recurrent CHF admissions who presented to [facility] on 07/02/16 with SOB.   Had cath 2011 at [facility] with normal coronaries. Admitted to [facility] with respiratory distress due to bronchitis in 2/16. She had been off HF meds as well due to noncompliance. These were resumed. Discharge weight was 227 pounds. Echo at that time showed EF 20-25% with severe LVH and suspected to be 2/2 hypertensive cardiomyopathy. Follow up ECHO in 10/2014 showed EF recovered to 50-55%.   She was last seen in the CHF clinic in 02/2015 for follow up. She was eating high salt foods and not weighing daily.   Since that time, she has been admitted multiple times to [facility]. She was supposed to continue following up for ongoing blood pressure checks and fluid management with their outpatient [facility] clinic; however patient no showed and rescheduled multiple times and ended up in the hospital several times once in mid November and again in late January and again in february. ECHO 04/2016: EF 25-30% with mild dilation and severe global HK, mild MR, mild-mod TR, mod LAE, mildly dilated RV with mildly decreased function. It looks like on her hospital stay her Lasix was decreased down to 20 mg. Patient did have some acute on chronic renal insufficiency during her hospital stay and I suspect that this is why her Lasix dose was decreased. In addition her spironolactone was stopped cause of the increasing creatinine. However the end of her hospital stay  her creatinine had improved back to baseline ~1.3.   Patient was just recently admitted at [facility] from 2/21-2/25 for congestive heart failure exacerbation. She reports following up with her primary care provider as advised as well as taking medications as prescribed. She was initially discharged home on oxygen. When she follow-up with her PCP approximately 10 days ago she was told that she did not need to continue the oxygen. Review of records shows that her weight was ~224 pounds.   Since that time she has been on Lasix 40mg  BID, sprio 25mg  daily, Hydralazine 50mg  TID, Coreg 25mg  BID. She reports compliance with these medications. She has not picked up atorvastatin or ASA.    She was in [place] visiting family when she started noticing worsening SOB and abdominal pain. She reported associated anxiety, dry cough, fatigue, chest pressure, nausea and abdominal pain. All these symptoms prevented her from getting any sleep.   ED Course: On admission into the emergency department patient was seen to be afebrile, pulse 117-120, respirations 24- 29, blood pressure up to 179/117, and O2 saturations in the lower 90s on room air. Lab work revealed creatinine of 1.44, troponin I 0.11, BNP 1907.5. D dimer 0.73. Urinalysis was negative. Chest x-ray appeared unchanged from 2/21 with cardiomegaly and bibasilar opacities. Patient was given 40 mg of Lasix IV in the ED. Due to the tachypnea and question of possible fatigue patient was recommended to be started on BiPAP.  She does admit to eating Ramen noodles recently. She is currently  feeling better. Has been urinating okay. No CP. Still with SOB, orthopnea and PND as well as LE edema. She is just very tired and wants to sleep.      Labs 06/12/2014 K 4.1 Creatinine 1.42  Labs 06/25/2014: 4.4 Creatinine 1.29  Labs 10/30/2014: K 3.9 Creatinine 1.17  Labs 06/10/2016: K4.1 Creatinine 1.40 Labs 06/22/2016: K 3.7 Creatinine 1.22 Labs 07/02/16: K 3.3 Creatinine  1.31   ECHO 06/10/2014: EF 20-25%, severe LVH, severe LAE. RV ok.  ECHO 7/16: EF 50-55%, RV normal size and systolic function. Grade I DD ECHO 04/2016: EF 25-30% with mild dilation and severe global HK, mild MR, mild-mod TR, mod LAE, mildly dilated RV with mildly decreased function.    Past Medical History:  Diagnosis Date  . Chronic systolic CHF   . HTN (hypertension)   . Non-ischemic cardiomyopathy ([place])   . Stroke ([place])    2010  . Tobacco abuse      Past Surgical History:  Procedure Laterality Date  . CARDIAC CATHETERIZATION     2012  . CAROTID STENT INSERTION     2010  . CESAREAN SECTION      Allergies  Allergen Reactions  . Sulfonamide Derivatives Hives    I have reviewed the patient's current medications . atorvastatin  20 mg Oral Daily  . carvedilol  25 mg Oral BID WC  . enoxaparin (LOVENOX) injection  1 mg/kg Subcutaneous Q12H  . furosemide  40 mg Intravenous BID  . hydrALAZINE  50 mg Oral Q8H  . sodium chloride flush  3 mL Intravenous Q12H  . spironolactone  25 mg Oral Daily    sodium chloride, acetaminophen, hydrALAZINE, ipratropium-albuterol, ondansetron (ZOFRAN) IV, sodium chloride flush, traMADol  Prior to Admission medications   Medication Sig Start Date End Date Taking? Authorizing Provider  acetaminophen (TYLENOL) 325 MG tablet Take 2 tablets (650 mg total) by mouth every 6 (six) hours as needed for mild pain, moderate pain, fever or headache (or Fever >/= 101). 06/12/14  Yes Modena Jansky, MD  albuterol (PROVENTIL) (2.5 MG/3ML) 0.083% nebulizer solution Take 2.5 mg by nebulization every 6 (six) hours as needed for wheezing or shortness of breath.   Yes Historical Provider, MD  atorvastatin (LIPITOR) 20 MG tablet TAKE 1 TABLET BY MOUTH ONCE DAILY Patient taking differently: TAKE 2 TABLET BY MOUTH ONCE DAILY 04/22/15  Yes Amy D Clegg, NP  carvedilol (COREG) 25 MG tablet Take 1 tablet (25 mg total) by mouth 2 (two) times daily with a meal. 02/25/15  Yes  Amy D Clegg, NP  furosemide (LASIX) 40 MG tablet TAKE 1 TABLET BY MOUTH TWICE DAILY 03/15/15  Yes Larey Dresser, MD  hydrALAZINE (APRESOLINE) 50 MG tablet TAKE 1 TABLET BY MOUTH 3 TIMES DAILY 04/22/15  Yes Amy D Clegg, NP  Ibuprofen (MIDOL) 200 MG CAPS Take 400 mg by mouth every 8 (eight) hours as needed (for pain.).   Yes Historical Provider, MD  spironolactone (ALDACTONE) 25 MG tablet TAKE 1 TABLET BY MOUTH ONCE DAILY 04/22/15  Yes Amy D Clegg, NP  traMADol (ULTRAM) 50 MG tablet Take 50 mg by mouth every 6 (six) hours as needed for moderate pain.   Yes Historical Provider, MD     Social History   Social History  . Marital status: Single    Spouse name: N/A  . Number of children: N/A  . Years of education: N/A   Occupational History  . Not on file.   Social History Main Topics  .  Smoking status: Light Tobacco Smoker    Packs/day: 0.30    Years: 12.00    Types: Cigarettes  . Smokeless tobacco: Former Systems developer    Quit date: 06/24/2014  . Alcohol use 0.0 oz/week     Comment: rarely  . Drug use: Unknown  . Sexual activity: Not on file   Other Topics Concern  . Not on file   Social History Narrative  . No narrative on file    Family Status  Relation Status  . Mother    Family History  Problem Relation Age of Onset  . CVA Mother     ROS:  Full 14 point review of systems complete and found to be negative unless listed above.  Physical Exam: Blood pressure (!) 154/128, pulse (!) 120, temperature 97 F (36.1 C), resp. rate (!) 28, weight 229 lb 4.5 oz (104 kg), SpO2 99 %.  General: Well developed, well nourished, female in no acute distress, obese Head: Eyes PERRLA, No xanthomas.   Normocephalic and atraumatic, oropharynx without edema or exudate.  Lungs: decreased breath sounds at bases.  Heart: HRRR S1 S2, no rub/gallop, Heart irregular rate and rhythm with S1, S2  No murmur. pulses are 2+ extrem.   Neck: No carotid bruits. No lymphadenopathy. ++ JVD. Abdomen: Bowel sounds  present, abdomen soft and non-tender without masses or hernias noted. Msk:  No spine or cva tenderness. No weakness, no joint deformities or effusions. Extremities: No clubbing or cyanosis.  Trace bilateral LE edema.  Neuro: Alert and oriented X 3. No focal deficits noted. Psych:  Good affect, responds appropriately Skin: No rashes or lesions noted.  Labs:   Lab Results  Component Value Date   WBC 7.7 07/02/2016   HGB 12.3 07/02/2016   HCT 39.0 07/02/2016   MCV 86.7 07/02/2016   PLT 235 07/02/2016   No results for input(s): INR in the last 72 hours.  Recent Labs Lab 07/02/16 0009 07/02/16 0715  NA 139 140  K 3.7 3.3*  CL 101 104  CO2 27 28  BUN 19 18  CREATININE 1.44* 1.31*  CALCIUM 9.3 9.0  PROT 6.4*  --   BILITOT 0.8  --   ALKPHOS 67  --   ALT 23  --   AST 30  --   GLUCOSE 114* 108*  ALBUMIN 3.7  --    Magnesium  Date Value Ref Range Status  11/07/2008 2.1 1.5 - 2.5 mg/dL Final    Recent Labs  07/02/16 0109 07/02/16 0715  TROPONINI 0.11* 0.10*   No results for input(s): TROPIPOC in the last 72 hours. Pro B Natriuretic peptide (BNP)  Date/Time Value Ref Range Status  11/05/2008 04:37 AM 1073.0 (H) 0.0 - 100.0 pg/mL Final  11/04/2008 05:48 PM 1990.0 (H) 0.0 - 100.0 pg/mL Final    Lab Results  Component Value Date   DDIMER 0.73 (H) 07/02/2016   Lipase  Date/Time Value Ref Range Status  07/02/2016 12:09 AM 13 11 - 51 U/L Final   TSH  Date/Time Value Ref Range Status  06/09/2014 05:08 PM 3.019 0.350 - 4.500 uIU/mL Final    Comment:    Performed at [facility]   T3, Free  Date/Time Value Ref Range Status  05/19/2007 05:00 AM 3.1 2.3 - 4.2 Final   Vitamin B-12  Date/Time Value Ref Range Status  04/06/2008 04:55 AM 523 211 - 911 pg/mL Final   Folate  Date/Time Value Ref Range Status  03/03/2008 10:15 AM   Final   >  20.0 (NOTE)  Reference Ranges        Deficient:       0.4 - 3.3 ng/mL        Indeterminate:   3.4 - 5.4 ng/mL         Normal:              > 5.4 ng/mL   Ferritin  Date/Time Value Ref Range Status  04/06/2008 04:55 AM 69 10 - 291 ng/mL Final   TIBC  Date/Time Value Ref Range Status  04/06/2008 04:55 AM 303 250 - 470 ug/dL Final   Iron  Date/Time Value Ref Range Status  04/06/2008 04:55 AM 14 (L) 42 - 135 ug/dL Final   Retic Ct Pct  Date/Time Value Ref Range Status  03/03/2008 10:15 AM 4.1 (H)  Final    Echo: 04/2016 Summary Mildly dilated left ventricle. Severe global hypokinesis. Ejection fraction is visually estimated at 25-30% Mild mitral regurgitation. Mild to moderate tricuspid regurgitation Moderately dilated left atrium. Mildly dilated right ventricle. Mildly decreased right ventricular function.  ECG:  Sinus tachycardia HR 117, LBBB - personally   TELE: LBBB, sinus tachycardia HR 130-190s - personally reviewed.  Radiology:  Dg Chest Port 1 View  Result Date: 07/02/2016 CLINICAL DATA:  Shortness of breath EXAM: PORTABLE CHEST 1 VIEW COMPARISON:  Chest radiograph 06/07/2016 FINDINGS: The cardiac silhouette remains massively enlarged. Bibasilar opacities are unchanged. No osseous abnormality. IMPRESSION: Unchanged massive cardiomegaly and bibasilar opacities. Electronically Signed   By: Ulyses Jarred M.D.   On: 07/02/2016 02:57    ASSESSMENT AND PLAN:    Principal Problem:   Acute on chronic combined systolic and diastolic heart failure (HCC) Active Problems:   TOBACCO ABUSE   CKD (chronic kidney disease) stage 3, GFR 30-59 ml/min   Troponin level elevated   Acute kidney injury ([place])   Accelerated hypertension   Bilateral lower extremity edema   Abdominal pain  [name] is a 46 y.o. female with a history of chronic systolic CHF (NICM since at least 2009), HTN, CVA (right sided MCA territory infarct in 2010), tobacco abuse, CKD, medical compliance and recurrent CHF admissions who presented to [facility] on 07/02/16 with SOB.  Acute chronic systolic CHF: EF 21-30% in 04/2016 at  [facility]. Felt to be hypertensive CM. Repeat 2D ECHO pending here. She follows with Dr. Clydie Braun with heart failure clinic in [place] where she lives. She was in [place] visiting family, which is why she came to [facility]. She would like to continue to follow there.  -- Her CXR showed "massive cardiomegaly and bibasilar opacities" and BNP elevated ~2000. She reports compliance with her medications but did eat Ramen noodles recently.  -- Currently on IV lasix 40mg  BID. Net neg 324mL and weight down 3 lbs. Weight 232 on admission and last discharge weight 224lbs.  -- Continue Coreg 25mg  BID, hydralazine 50mg  TID. Will add imdur 60mg  daily with elevated BPs. ACE was discontinued in the past due to AKI. Will continue to keep off given aggressive diuresis.  Tachycardia: HRs got up to 190 while up to go to the bathroom. Given 5mg  IV lopressor which brought HRs down to 110s. Just got her AM dose of Coreg 25mg  daily.  Now resting comfortably in bed. Continue to monitor.  Elevated troponin: low and flat trend 0.11--> 0.10 c/w demand ischemia in the setting of acute CHF. Normal cors on cath in 2010 in [place]  Elevated D-dimer: ~0.73. Review of previous labs show this has been chronically elevated.  HTN: BP has been uncontrolled. Will add imdur 60mg  daily to help with pressures and CHF  CKD: creat around recent baseline ~ 1.3. Continue to monitor with diuresis  Hx of CVA: continue ASA and statin  Tobacco abuse: counseled on the importance of complete cessation   Signed: Angelena Form, PA-C 07/02/2016 8:24 AM  Pager [phone]  Co-Sign MD  Attending Note:   The patient was seen and examined.  Agree with assessment and plan as noted above.  Changes made to the above note as needed.  Patient seen and independently examined with Nell Range, PA .   We discussed all aspects of the encounter. I agree with the assessment and plan as stated above.  1. Acute on chronic CHF:  Has been eating lots of salty  foods (  'Oodles of Noodles", other salty foods) She presents with decompensated CHF   She was extremely tachycardic this am.  Especially when she was up using the bedside commode. Better after metoprolol 5 mg IV   Exam shows volume overload. Needs aggressive diuresis Continue IV lasix BID .  She may need a foley if she continues to decompensate when she uses the bedside commode or bed pan .    2. Troponin elevation :  Her elevated Troponin is c/w CHF exacerbation - not ACS She has no symptoms that would suggest acute coronary syndrome   3. Essential HTN: Will increase hydralizine.   Has CKD.   Is already on Aldactone . She may tolerate addition of ARB.   Will see how she does.      I have spent a total of 40 minutes with patient reviewing hospital  notes , telemetry, EKGs, labs and examining patient as well as establishing an assessment and plan that was discussed with the patient. > 50% of time was spent in direct patient care.    Thayer Headings, Brooke Bonito., MD, [facility] 07/02/2016, 10:35 AM [address],  [place] Pager [phone]

## 2016-07-02 NOTE — ED Notes (Signed)
First IV was infiltrated.  Lasix did not go in.

## 2016-07-02 NOTE — ED Triage Notes (Addendum)
Pt complaining of some mid abdominal pain. Pt states causing anxiety. Pt states has not slept in 5 days. Pt denies any urinary symptoms. Pt complaining of some nausea, no vomiting, no diarrhea. Pt tachy at triage, 120. Pt denies any chest pain or SOB.

## 2016-07-02 NOTE — ED Provider Notes (Signed)
[facility] DEPT Provider Note   CSN: [id number] Arrival date & time: 07/01/16  2358  By signing my name below, I, [name], attest that this documentation has been prepared under the direction and in the presence of Varney Biles, MD . Electronically Signed: Dolores [name], Scribe. 07/02/2016. 1:06 AM.  History   Chief Complaint Chief Complaint  Patient presents with  . Abdominal Pain   The history is provided by the patient. No language interpreter was used.    HPI Comments:  [name] is a 46 y.o. female with pmhx of CHF, HTN and strokewho presents to the Emergency Department complaining of constant, worsening SOB which has been going one for the past month but has exacerbated in the past 5 days. Her SOB is exacerbated by exertion. Pt reports associated anxiety, dry cough, fatigue, chest pressure, nausea and abdominal pain. She states that her symptoms have prevented her from sleeping for 5 days and she feels as though this has worsened her symptoms. Pt describes her abdominal pain as a diffuse burning sensation. She denies any recent surgical history or history of travel.   Past Medical History:  Diagnosis Date  . Chronic systolic CHF   . HTN (hypertension)   . Non-ischemic cardiomyopathy ([place])   . Stroke ([place])    2010  . Tobacco abuse     Patient Active Problem List   Diagnosis Date Noted  . Noncompliance 07/08/2014  . Chronic systolic heart failure ([place]) 06/26/2014  . Pleuritic chest pain   . Acute systolic CHF (congestive heart failure) ([place])   . Acute kidney injury ([place])   . Tachycardia 06/09/2014  . Acute respiratory failure with hypoxia ([place]) 06/09/2014  . Acute on chronic combined systolic and diastolic heart failure ([place]) 06/09/2014  . CKD (chronic kidney disease) stage 3, GFR 30-59 ml/min 06/09/2014  . Leukocytosis 06/09/2014  . Anemia of chronic disease 06/09/2014  . Dyslipidemia 06/09/2014  . Troponin level elevated 06/09/2014  . TOBACCO ABUSE  09/25/2008  . HYPERTENSION, BENIGN ESSENTIAL 09/25/2008  . SLEEP APNEA 09/25/2008  . CVA 05/18/2008    Past Surgical History:  Procedure Laterality Date  . CARDIAC CATHETERIZATION     2012  . CAROTID STENT INSERTION     2010  . CESAREAN SECTION      OB History    No data available       Home Medications    Prior to Admission medications   Medication Sig Start Date End Date Taking? Authorizing Provider  acetaminophen (TYLENOL) 325 MG tablet Take 2 tablets (650 mg total) by mouth every 6 (six) hours as needed for mild pain, moderate pain, fever or headache (or Fever >/= 101). 06/12/14  Yes Modena Jansky, MD  albuterol (PROVENTIL) (2.5 MG/3ML) 0.083% nebulizer solution Take 2.5 mg by nebulization every 6 (six) hours as needed for wheezing or shortness of breath.   Yes Historical Provider, MD  atorvastatin (LIPITOR) 20 MG tablet TAKE 1 TABLET BY MOUTH ONCE DAILY Patient taking differently: TAKE 2 TABLET BY MOUTH ONCE DAILY 04/22/15  Yes Amy D Clegg, NP  carvedilol (COREG) 25 MG tablet Take 1 tablet (25 mg total) by mouth 2 (two) times daily with a meal. 02/25/15  Yes Amy D Clegg, NP  furosemide (LASIX) 40 MG tablet TAKE 1 TABLET BY MOUTH TWICE DAILY 03/15/15  Yes Larey Dresser, MD  hydrALAZINE (APRESOLINE) 50 MG tablet TAKE 1 TABLET BY MOUTH 3 TIMES DAILY 04/22/15  Yes Amy Estrella Deeds, NP  Ibuprofen (MIDOL) 200  MG CAPS Take 400 mg by mouth every 8 (eight) hours as needed (for pain.).   Yes Historical Provider, MD  spironolactone (ALDACTONE) 25 MG tablet TAKE 1 TABLET BY MOUTH ONCE DAILY 04/22/15  Yes Amy D Clegg, NP  traMADol (ULTRAM) 50 MG tablet Take 50 mg by mouth every 6 (six) hours as needed for moderate pain.   Yes Historical Provider, MD    Family History Family History  Problem Relation Age of Onset  . CVA Mother     Social History Social History  Substance Use Topics  . Smoking status: Light Tobacco Smoker    Packs/day: 0.30    Years: 12.00    Types: Cigarettes  .  Smokeless tobacco: Former Systems developer    Quit date: 06/24/2014  . Alcohol use 0.0 oz/week     Comment: rarely     Allergies   Sulfonamide derivatives   Review of Systems Review of Systems 10 systems reviewed and all are negative for acute change except as noted in the HPI.  Physical Exam Updated Vital Signs BP (!) 164/121 (BP Location: Right Arm)   Pulse (!) 119   Temp 98.7 F (37.1 C) (Oral)   Resp (!) 29   Wt 232 lb 2.3 oz (105.3 kg)   SpO2 94%   BMI 39.85 kg/m   Physical Exam  Constitutional: She is oriented to person, place, and time. She appears well-developed and well-nourished. No distress.  HENT:  Head: Normocephalic and atraumatic.  Eyes: Conjunctivae are normal.  Neck: No JVD present.  Cardiovascular: Normal rate.   Murmur heard. Pulmonary/Chest: Effort normal and breath sounds normal. She has no wheezes.  Abdominal: She exhibits no distension.  Musculoskeletal: She exhibits edema.  1+ pitting edema bilaterally  Neurological: She is alert and oriented to person, place, and time.  Skin: Skin is warm and dry.  Psychiatric: She has a normal mood and affect.  Nursing note and vitals reviewed.  ED Treatments / Results  DIAGNOSTIC STUDIES:  Oxygen Saturation is 96% on RA, adequate by my interpretation.    COORDINATION OF CARE:  1:21 AM Discussed treatment plan with pt at bedside which includes rule out and pt agreed to plan.  2:53 AM Informed patient of blood work and imaging results. Informed patient of admission to hospital.   Labs (all labs ordered are listed, but only abnormal results are displayed) Labs Reviewed  COMPREHENSIVE METABOLIC PANEL - Abnormal; Notable for the following:       Result Value   Glucose, Bld 114 (*)    Creatinine, Ser 1.44 (*)    Total Protein 6.4 (*)    GFR calc non Af Amer 43 (*)    GFR calc Af Amer 50 (*)    All other components within normal limits  CBC - Abnormal; Notable for the following:    RDW 17.4 (*)    All other  components within normal limits  URINALYSIS, ROUTINE W REFLEX MICROSCOPIC - Abnormal; Notable for the following:    Protein, ur >=300 (*)    Squamous Epithelial / LPF 0-5 (*)    All other components within normal limits  BRAIN NATRIURETIC PEPTIDE - Abnormal; Notable for the following:    B Natriuretic Peptide 1,907.5 (*)    All other components within normal limits  TROPONIN I - Abnormal; Notable for the following:    Troponin I 0.11 (*)    All other components within normal limits  LIPASE, BLOOD    EKG  EKG Interpretation  Date/Time:  Sunday July 02 2016 01:04:44 EDT Ventricular Rate:  117 PR Interval:    QRS Duration: 124 QT Interval:  443 QTC Calculation: 619 R Axis:   129 Text Interpretation:  Sinus tachycardia Biatrial enlargement Nonspecific intraventricular conduction delay v1-v6 ST elevation Probable anteroseptal infarct, old TWI in the inferior and lateral leads have resolved Confirmed by Kathrynn Humble, MD, Thelma Comp [phone]) on 07/02/2016 1:09:19 AM       Radiology Dg Chest Port 1 View  Result Date: 07/02/2016 CLINICAL DATA:  Shortness of breath EXAM: PORTABLE CHEST 1 VIEW COMPARISON:  Chest radiograph 06/07/2016 FINDINGS: The cardiac silhouette remains massively enlarged. Bibasilar opacities are unchanged. No osseous abnormality. IMPRESSION: Unchanged massive cardiomegaly and bibasilar opacities. Electronically Signed   By: Ulyses Jarred M.D.   On: 07/02/2016 02:57    Procedures Procedures (including critical care time)   CRITICAL CARE Performed by: Varney Biles   Total critical care time: 50 minutes  Critical care time was exclusive of separately billable procedures and treating other patients.  Critical care was necessary to treat or prevent imminent or life-threatening deterioration.  Critical care was time spent personally by me on the following activities: development of treatment plan with patient and/or surrogate as well as nursing, discussions with  consultants, evaluation of patient's response to treatment, examination of patient, obtaining history from patient or surrogate, ordering and performing treatments and interventions, ordering and review of laboratory studies, ordering and review of radiographic studies, pulse oximetry and re-evaluation of patient's condition.   Medications Ordered in ED Medications  furosemide (LASIX) injection 40 mg (not administered)     Initial Impression / Assessment and Plan / ED Course  I have reviewed the triage vital signs and the nursing notes.  Pertinent labs & imaging results that were available during my care of the patient were reviewed by me and considered in my medical decision making (see chart for details).  Clinical Course as of Jul 03 310  Nancy Fetter Jul 02, 2016  0310 Pt has acute pulmonary edema. Weight is up from 211 normally to 232 lbs now. We will give iv lasix. Start CPAP for resp distress. DG Chest Port 1 View [AN]    Clinical Course User Index [AN] Varney Biles, MD   Pt comes in with cc of DIB. PT thinks her symptoms are due to anxiety. Pt has advanced CHF however. She has orthopnea. She is having a clot of cough. Clinically - I didn't here crackles, but I am concerned that pt is indeed having acute CHF exacerbation. PT has hx of NICM. She is tachycardic. EKG has no acute changes. Pt has no PE risk factors.    Final Clinical Impressions(s) / ED Diagnoses   Final diagnoses:  Acute on chronic systolic congestive heart failure (HCC)  Elevated troponin  Acute respiratory distress    New Prescriptions New Prescriptions   No medications on file   I personally performed the services described in this documentation, which was scribed in my presence. The recorded information has been reviewed and is accurate.     Varney Biles, MD 07/02/16 [phone]

## 2016-07-03 ENCOUNTER — Inpatient Hospital Stay (HOSPITAL_COMMUNITY): Payer: Self-pay

## 2016-07-03 DIAGNOSIS — R609 Edema, unspecified: Secondary | ICD-10-CM

## 2016-07-03 MED ORDER — HYDRALAZINE HCL 50 MG PO TABS
100.0000 mg | ORAL_TABLET | Freq: Three times a day (TID) | ORAL | Status: DC
  Administered 2016-07-03 – 2016-07-04 (×3): 100 mg via ORAL
  Filled 2016-07-03 (×3): qty 2

## 2016-07-03 MED ORDER — METOLAZONE 5 MG PO TABS
2.5000 mg | ORAL_TABLET | Freq: Once | ORAL | Status: AC
  Administered 2016-07-03: 2.5 mg via ORAL
  Filled 2016-07-03: qty 1

## 2016-07-03 MED ORDER — ASPIRIN 81 MG PO CHEW
81.0000 mg | CHEWABLE_TABLET | Freq: Every day | ORAL | Status: DC
  Administered 2016-07-03 – 2016-07-04 (×2): 81 mg via ORAL
  Filled 2016-07-03 (×2): qty 1

## 2016-07-03 MED ORDER — POTASSIUM CHLORIDE CRYS ER 20 MEQ PO TBCR
40.0000 meq | EXTENDED_RELEASE_TABLET | Freq: Four times a day (QID) | ORAL | Status: AC
  Administered 2016-07-03 (×2): 40 meq via ORAL
  Filled 2016-07-03 (×2): qty 2

## 2016-07-03 MED ORDER — GUAIFENESIN-DM 100-10 MG/5ML PO SYRP: 10.0000 mL | ORAL_SOLUTION | Freq: Four times a day (QID) | ORAL | Status: DC | PRN

## 2016-07-03 MED ORDER — ENOXAPARIN SODIUM 100 MG/ML ~~LOC~~ SOLN
100.0000 mg | Freq: Two times a day (BID) | SUBCUTANEOUS | Status: DC
  Administered 2016-07-03 – 2016-07-04 (×2): 100 mg via SUBCUTANEOUS
  Filled 2016-07-03 (×2): qty 1

## 2016-07-03 MED ORDER — DIPHENHYDRAMINE HCL 25 MG PO CAPS
25.0000 mg | ORAL_CAPSULE | Freq: Four times a day (QID) | ORAL | Status: DC | PRN
  Administered 2016-07-03: 25 mg via ORAL
  Filled 2016-07-03: qty 1

## 2016-07-03 NOTE — Plan of Care (Cosign Needed)
Problem: Food- and Nutrition-Related Knowledge Deficit (NB-1.1) Goal: Nutrition education Formal process to instruct or train a patient/client in a skill or to impart knowledge to help patients/clients voluntarily manage or modify food choices and eating behavior to maintain or improve health.  Outcome: Progressing Provided heart healthy nutrition education. Provided "Heart Healthy Nutrition Therapy" education handout from the Academy of [employer]etics. Topics covered include: Tips for choosing heart-healthy fats, tips for choosing heart-healthy carbohydrates, Foods rich in soluble fiber, Lifestyle tips for maintaining a healthy weight, Specific foods recommended (and not recommended) for heart health, Heart-healthy eating sample 1-day menu. Teach back method used. Expect good compliance.  Juliann Pulse M.S. Nutrition Dietetic Intern

## 2016-07-03 NOTE — Progress Notes (Signed)
SATURATION QUALIFICATIONS: (This note is used to comply with regulatory documentation for home oxygen)  Patient Saturations on Room Air at Rest = 94%  Patient Saturations on Room Air while Ambulating = 91%  Patient Saturations on    Liters of oxygen while Ambulating =  %  Please briefly explain why patient needs home oxygen:   [name]

## 2016-07-03 NOTE — Progress Notes (Signed)
PROGRESS NOTE                                                                                                                                                                                                             Patient Demographics:    [name], is a 46 y.o. female, DOB - [birth year], KGM:[id number]  Admit date - 07/02/2016   Admitting Physician Norval Morton, MD  Outpatient Primary MD for the patient is Pcp Not In System  LOS - 1  Chief Complaint  Patient presents with  . Abdominal Pain       Brief Narrative  [name] is a 46 y.o. female with medical history significant of HTN, NICM, sCHF last EF 25-30% in 04/2016; who presents with complaints of progressively worsening shortness of breath or last week. Patient was just recently admitted at [facility] from 2/21-2/25 for congestive heart failure exacerbation. She's been eating a lot of canned food and salty no loose lately. Was admitted for CHF exacerbation.   Subjective:    [name] today has, No headache, No chest pain, No abdominal pain - No Nausea, No new weakness tingling or numbness, No Cough - improved SOB.     Assessment  & Plan :     1.Acute on chronic combined systolic and diastolic heart failure secondary to nonischemic cardiomyopathy. Last EF in January 2018 of 25%. Appears to be due to dietary noncompliance. Patient counseled, dietitian consulted, cardiology on board. She is much improved with diuresis which will be continued with IV Lasix along with oral Zaroxolyn as needed, continue combination of Coreg, hydralazine, Aldactone and Imdur. She is refusing medications in the hospital as well, counseled. No ACE/ARB due to see daily 3.  2. Hypertension. Poorly controlled. Hydralazine dose doubled, continue Imdur, Coreg along with diuretics and monitor.  3. Obstructive sleep apnea. CPAP daily at bedtime.  4. CK D3. Baseline  creatinine close to 1.4 Will monitor with diuresis.  5. Mild troponin leak. Not in ACS pattern and trend is flat, due to CHF, will add 81 mg of aspirin, continue Coreg and statin for secondary prevention and monitor. No chest pain.  6. Ongoing smoking. Counseled to quit.  7. Dyslipidemia. On statin.   Diet : Diet Heart Room service appropriate? Yes; Fluid consistency: Thin; Fluid restriction: 1500 mL Fluid  Family Communication  :  None  Code Status :  Full  Disposition Plan  :  Home 1-2 days  Consults  :  Cards  Procedures  :  None  DVT Prophylaxis  :  Lovenox    Lab Results  Component Value Date   PLT 235 07/02/2016    Inpatient Medications  Scheduled Meds: . atorvastatin  20 mg Oral Daily  . carvedilol  25 mg Oral BID WC  . enoxaparin (LOVENOX) injection  1 mg/kg Subcutaneous Q12H  . furosemide  40 mg Intravenous BID  . hydrALAZINE  100 mg Oral Q8H  . isosorbide mononitrate  60 mg Oral Daily  . potassium chloride  40 mEq Oral Q6H  . sodium chloride flush  3 mL Intravenous Q12H  . spironolactone  25 mg Oral Daily   Continuous Infusions: PRN Meds:.sodium chloride, acetaminophen, hydrALAZINE, ipratropium-albuterol, metoprolol, ondansetron (ZOFRAN) IV, sodium chloride flush, traMADol  Antibiotics  :    Anti-infectives    None         Objective:   Vitals:   07/02/16 2115 07/03/16 0022 07/03/16 0417 07/03/16 0700  BP: 132/89 130/90 (!) 141/92 (!) 145/99  Pulse: 88 82 87 87  Resp: 14 (!) 26 16 16   Temp: 97.5 F (36.4 C) 97.9 F (36.6 C) 98.6 F (37 C) 97.5 F (36.4 C)  TempSrc: Oral Axillary Oral Oral  SpO2: 100% 100% 99% 100%  Weight:   103.1 kg (227 lb 4.8 oz)     Wt Readings from Last 3 Encounters:  07/03/16 103.1 kg (227 lb 4.8 oz)  03/04/15 105.7 kg (233 lb)  10/30/14 99.8 kg (220 lb)     Intake/Output Summary (Last 24 hours) at 07/03/16 1105 Last data filed at 07/03/16 0700  Gross per 24 hour  Intake              440 ml  Output              1580 ml  Net            -1140 ml     Physical Exam  Awake Alert, Oriented X 3, No new F.N deficits, Normal affect [name],[name] Supple Neck,No JVD, No cervical lymphadenopathy appriciated.  Symmetrical Chest wall movement, Good air movement bilaterally,+ve rales RRR,No Gallops,Rubs or new Murmurs, No Parasternal Heave +ve B.Sounds, Abd Soft, No tenderness, No organomegaly appriciated, No rebound - guarding or rigidity. No Cyanosis, Clubbing , trace edema, No new Rash or bruise     Data Review:    CBC  Recent Labs Lab 07/02/16 0009  WBC 7.7  HGB 12.3  HCT 39.0  PLT 235  MCV 86.7  MCH 27.3  MCHC 31.5  RDW 17.4*    Chemistries   Recent Labs Lab 07/02/16 0009 07/02/16 0715  NA 139 140  K 3.7 3.3*  CL 101 104  CO2 27 28  GLUCOSE 114* 108*  BUN 19 18  CREATININE 1.44* 1.31*  CALCIUM 9.3 9.0  AST 30  --   ALT 23  --   ALKPHOS 67  --   BILITOT 0.8  --    ------------------------------------------------------------------------------------------------------------------ No results for input(s): CHOL, HDL, LDLCALC, TRIG, CHOLHDL, LDLDIRECT in the last 72 hours.  Lab Results  Component Value Date   HGBA1C  07/20/2008    5.7 (NOTE)   The ADA recommends the following therapeutic goal for glycemic   control related to Hgb A1C measurement:   Goal of Therapy:   < 7.0% Hgb A1C  Reference: American Diabetes Association: Clinical Practice   Recommendations 2008, Diabetes Care,  2008, 31:(Suppl 1).   ------------------------------------------------------------------------------------------------------------------ No results for input(s): TSH, T4TOTAL, T3FREE, THYROIDAB in the last 72 hours.  Invalid input(s): FREET3 ------------------------------------------------------------------------------------------------------------------ No results for input(s): VITAMINB12, FOLATE, FERRITIN, TIBC, IRON, RETICCTPCT in the last 72 hours.  Coagulation profile No results for  input(s): INR, PROTIME in the last 168 hours.   Recent Labs  07/02/16 0715  DDIMER 0.73*    Cardiac Enzymes  Recent Labs Lab 07/02/16 0109 07/02/16 0715  TROPONINI 0.11* 0.10*   ------------------------------------------------------------------------------------------------------------------    Component Value Date/Time   BNP 1,907.5 (H) 07/02/2016 0109    Micro Results Recent Results (from the past 240 hour(s))  MRSA PCR Screening     Status: None   Collection Time: 07/02/16  5:03 AM  Result Value Ref Range Status   MRSA by PCR NEGATIVE NEGATIVE Final    Comment:        The GeneXpert MRSA Assay (FDA approved for NASAL specimens only), is one component of a comprehensive MRSA colonization surveillance program. It is not intended to diagnose MRSA infection nor to guide or monitor treatment for MRSA infections.     Radiology Reports Dg Chest Port 1 View  Result Date: 07/02/2016 CLINICAL DATA:  Shortness of breath EXAM: PORTABLE CHEST 1 VIEW COMPARISON:  Chest radiograph 06/07/2016 FINDINGS: The cardiac silhouette remains massively enlarged. Bibasilar opacities are unchanged. No osseous abnormality. IMPRESSION: Unchanged massive cardiomegaly and bibasilar opacities. Electronically Signed   By: Ulyses Jarred M.D.   On: 07/02/2016 02:57    Time Spent in minutes  30   SINGH,PRASHANT K M.D on 07/03/2016 at 11:05 AM  Between 7am to 7pm - Pager - [phone] ( page via [facility], text pages only, please mention full 10 digit call back number).  After 7pm go to [email or web address] - password [facility]  [facility] -  Office  [phone]

## 2016-07-03 NOTE — Progress Notes (Signed)
*  PRELIMINARY RESULTS* Vascular Ultrasound Bilateral lower extremity venous duplex has been completed.  Preliminary findings: No evidence of deep vein thrombosis or baker's cysts bilaterally.   Everrett Coombe 07/03/2016, 4:31 PM

## 2016-07-04 ENCOUNTER — Encounter (HOSPITAL_COMMUNITY): Payer: Self-pay | Admitting: *Deleted

## 2016-07-04 LAB — BASIC METABOLIC PANEL
Anion gap: 9 (ref 5–15)
BUN: 21 mg/dL — AB (ref 6–20)
CALCIUM: 9.1 mg/dL (ref 8.9–10.3)
CO2: 35 mmol/L — ABNORMAL HIGH (ref 22–32)
Chloride: 97 mmol/L — ABNORMAL LOW (ref 101–111)
Creatinine, Ser: 1.48 mg/dL — ABNORMAL HIGH (ref 0.44–1.00)
GFR calc Af Amer: 48 mL/min — ABNORMAL LOW (ref >60)
GFR, EST NON AFRICAN AMERICAN: 41 mL/min — AB (ref >60)
GLUCOSE: 76 mg/dL (ref 65–99)
Potassium: 3.7 mmol/L (ref 3.5–5.1)
Sodium: 141 mmol/L (ref 135–145)

## 2016-07-04 LAB — CBC
HCT: 37.1 % (ref 36.0–46.0)
Hemoglobin: 11.2 g/dL — ABNORMAL LOW (ref 12.0–15.0)
MCH: 26.8 pg (ref 26.0–34.0)
MCHC: 30.2 g/dL (ref 30.0–36.0)
MCV: 88.8 fL (ref 78.0–100.0)
Platelets: 198 10*3/uL (ref 150–400)
RBC: 4.18 MIL/uL (ref 3.87–5.11)
RDW: 17 % — AB (ref 11.5–15.5)
WBC: 4.5 10*3/uL (ref 4.0–10.5)

## 2016-07-04 MED ORDER — ASPIRIN 81 MG PO CHEW: 81.0000 mg | CHEWABLE_TABLET | Freq: Every day | ORAL | 0 refills | Status: DC

## 2016-07-04 MED ORDER — FUROSEMIDE 20 MG PO TABS: 60.0000 mg | ORAL_TABLET | Freq: Two times a day (BID) | ORAL | 0 refills | Status: DC

## 2016-07-04 MED ORDER — ISOSORBIDE MONONITRATE ER 30 MG PO TB24: 30.0000 mg | ORAL_TABLET | Freq: Every day | ORAL | 0 refills | Status: DC

## 2016-07-04 MED ORDER — HYDRALAZINE HCL 100 MG PO TABS: 100.0000 mg | ORAL_TABLET | Freq: Three times a day (TID) | ORAL | 0 refills | Status: DC

## 2016-07-04 MED ORDER — LORAZEPAM 1 MG PO TABS
1.0000 mg | ORAL_TABLET | Freq: Once | ORAL | Status: AC
  Administered 2016-07-04: 1 mg via ORAL
  Filled 2016-07-04: qty 1

## 2016-07-04 MED ORDER — METOLAZONE 5 MG PO TABS
2.5000 mg | ORAL_TABLET | Freq: Once | ORAL | Status: AC
  Administered 2016-07-04: 2.5 mg via ORAL
  Filled 2016-07-04: qty 1

## 2016-07-04 MED ORDER — POTASSIUM CHLORIDE ER 20 MEQ PO TBCR: 20.0000 meq | EXTENDED_RELEASE_TABLET | Freq: Every day | ORAL | 0 refills | Status: DC

## 2016-07-04 NOTE — Care Management Note (Signed)
Case Management Note  Patient Details  Name: [name] MRN: [id number] Date of Birth: July 03, 1970  Subjective/Objective:  Pt presented for acute on chronic CHF. Pt is from home with plans to return home. Pt has a job, however no insurance at this time. Per pt she will be able to get family assistance with new medication costs.                   Action/Plan: CM did receive Heart Failure Referral- pt is not homebound and will not qualify for [place] at this time. No further needs from CM.   Expected Discharge Date:  07/04/16               Expected Discharge Plan:  Home/Self Care  In-House Referral:  NA  Discharge planning Services  CM Consult, Medication Assistance  Post Acute Care Choice:  NA Choice offered to:  NA  DME Arranged:  N/A DME Agency:  NA  HH Arranged:  NA HH Agency:  NA  Status of Service:  Completed, signed off  If discussed at [place] of Stay Meetings, dates discussed:    Additional Comments:  Bethena Roys, RN 07/04/2016, 12:00 PM

## 2016-07-04 NOTE — Discharge Summary (Signed)
[name] HBZ:[id number] DOB: 05-Jan-1971 DOA: 07/02/2016  PCP: Alwyn Ren, MD  Admit date: 07/02/2016  Discharge date: 07/04/2016  Admitted From:  Home  Disposition:  Home   Recommendations for Outpatient Follow-up:   Follow up with PCP in 1-2 weeks  PCP Please obtain BMP/CBC, 2 view CXR in 1week,  (see Discharge instructions)   PCP Please follow up on the following pending results: Monitor weight, diuretic dose and BMP closely, monitor dietary compliance   Home Health: None   Equipment/Devices: None  Consultations: Cards Discharge Condition: Stable   CODE STATUS: Full   Diet Recommendation: Diet Heart Healthy Fluid restriction: 1500 mL Fluid/day   Chief Complaint  Patient presents with  . Abdominal Pain     Brief history of present illness from the day of admission and additional interim summary    [name] a 46 y.o.femalewith medical history significant of HTN, NICM, sCHF last EF 25-30% in 04/2016; who presents withcomplaints of progressively worsening shortness of breath or last week.Patient was just recently admitted at [facility] from 2/21-2/25 for congestive heart failure exacerbation. She's been eating a lot of canned food and salty no loose lately. Was admitted for CHF exacerbation.                                                                 Hospital Course   1.Acute on chronic combined systolic and diastolic heart failure secondary to nonischemic cardiomyopathy. Last EF in January 2018 of 25%. Appears to be due to dietary noncompliance. Patient counseled, dietitian was provided for education as well, she was seen by cardiology and diuresed with IV Lasix along with oral Zaroxolyn, she was continued on combination of Coreg, hydralazine, Aldactone and Imdur. She has  shown signs of noncompliance even in the hospital by refusing medications from time to time, again counseled. She is now back to her baseline.   Ambulated on room air without any discomfort, she also has when necessary oxygen at home which she will continue. She will be discharged on Lasix 60 twice a day along with combination of Coreg, hydralazine, Imdur and Aldactone. Will also be placed on low-dose potassium. Request PCP to monitor BMP, weight and diuretic dose closely and make sure patient follows with her cardiologist within a week.  2. Hypertension. Poorly controlled. Hydralazine dose doubled, continue Imdur, Coreg along with diuretics, blood pressure better now ECP to continue to monitor and adjust medications as needed.  3. Obstructive sleep apnea. Oxygen nighttime, defer sleep study to PCP.  4. CK D3. Baseline creatinine close to 1.4 , stable.  5. Mild troponin leak. Not in ACS pattern and trend is flat, due to CHF, will add 81 mg of aspirin, continue Coreg and statin for secondary prevention and monitor. No chest pain.  6. Ongoing  smoking. Counseled to quit.  7. Dyslipidemia. On statin.   Discharge diagnosis     Principal Problem:   Acute on chronic combined systolic and diastolic heart failure (HCC) Active Problems:   TOBACCO ABUSE   CKD (chronic kidney disease) stage 3, GFR 30-59 ml/min   Troponin level elevated   Acute kidney injury (HCC)   Accelerated hypertension   Bilateral lower extremity edema   Abdominal pain    Discharge instructions    Discharge Instructions    Discharge instructions    Complete by:  As directed    Follow with Primary MD in 7 days   Get CBC, CMP, 2 view Chest X ray checked  by Primary MD or SNF MD in 5-7 days ( we routinely change or add medications that can affect your baseline labs and fluid status, therefore we recommend that you get the mentioned basic workup next visit with your PCP, your PCP may decide not to get them or add  new tests based on their clinical decision)  Activity: As tolerated with Full fall precautions use walker/cane & assistance as needed  Disposition Home   Diet:   Diet Heart Healthy, Fluid restriction: 1500 mL Fluid/day.  For Heart failure patients - Check your Weight same time everyday, if you gain over 2 pounds, or you develop in leg swelling, experience more shortness of breath or chest pain, call your Primary MD immediately. Follow Cardiac Low Salt Diet and 1.5 lit/day fluid restriction.  On your next visit with your primary care physician please Get Medicines reviewed and adjusted.  Please request your Prim.MD to go over all Hospital Tests and Procedure/Radiological results at the follow up, please get all Hospital records sent to your Prim MD by signing hospital release before you go home.  If you experience worsening of your admission symptoms, develop shortness of breath, life threatening emergency, suicidal or homicidal thoughts you must seek medical attention immediately by calling 911 or calling your MD immediately  if symptoms less severe.  You Must read complete instructions/literature along with all the possible adverse reactions/side effects for all the Medicines you take and that have been prescribed to you. Take any new Medicines after you have completely understood and accpet all the possible adverse reactions/side effects.   Do not drive, operate heavy machinery, perform activities at heights, swimming or participation in water activities or provide baby sitting services if your were admitted for syncope or siezures until you have seen by Primary MD or a Neurologist and advised to do so again.  Do not drive when taking Pain medications.    Do not take more than prescribed Pain, Sleep and Anxiety Medications  Special Instructions: If you have smoked or chewed Tobacco  in the last 2 yrs please stop smoking, stop any regular Alcohol  and or any Recreational drug use.  Wear  Seat belts while driving.   Please note  You were cared for by a hospitalist during your hospital stay. If you have any questions about your discharge medications or the care you received while you were in the hospital after you are discharged, you can call the unit and asked to speak with the hospitalist on call if the hospitalist that took care of you is not available. Once you are discharged, your primary care physician will handle any further medical issues. Please note that NO REFILLS for any discharge medications will be authorized once you are discharged, as it is imperative that you return to your primary care  physician (or establish a relationship with a primary care physician if you do not have one) for your aftercare needs so that they can reassess your need for medications and monitor your lab values.                                                       Ireene Ballowe was admitted to the Hospital on 07/02/2016 and Discharged  07/04/2016 and should be excused from work/school   for 5 days starting from date -  07/02/2016 , may return to work/school without any restrictions.  Call [name] MD, [facility]  [phone] with questions.  [name] M.D on 07/04/2016,at 11:28 AM  [facility]   Office  [phone]   Increase activity slowly    Complete by:  As directed       Discharge Medications   Allergies as of 07/04/2016      Reactions   Sulfonamide Derivatives Hives      Medication List    STOP taking these medications   MIDOL 200 MG Caps Generic drug:  Ibuprofen     TAKE these medications   acetaminophen 325 MG tablet Commonly known as:  TYLENOL Take 2 tablets (650 mg total) by mouth every 6 (six) hours as needed for mild pain, moderate pain, fever or headache (or Fever >/= 101).   albuterol (2.5 MG/3ML) 0.083% nebulizer solution Commonly known as:  PROVENTIL Take 2.5 mg by nebulization every 6 (six) hours as needed for wheezing or  shortness of breath.   aspirin 81 MG chewable tablet Chew 1 tablet (81 mg total) by mouth daily. Start taking on:  07/05/2016   atorvastatin 20 MG tablet Commonly known as:  LIPITOR TAKE 1 TABLET BY MOUTH ONCE DAILY What changed:  See the new instructions.   carvedilol 25 MG tablet Commonly known as:  COREG Take 1 tablet (25 mg total) by mouth 2 (two) times daily with a meal.   furosemide 20 MG tablet Commonly known as:  LASIX Take 3 tablets (60 mg total) by mouth 2 (two) times daily. What changed:  See the new instructions.   hydrALAZINE 100 MG tablet Commonly known as:  APRESOLINE Take 1 tablet (100 mg total) by mouth every 8 (eight) hours. What changed:  See the new instructions.   isosorbide mononitrate 30 MG 24 hr tablet Commonly known as:  IMDUR Take 1 tablet (30 mg total) by mouth daily. Start taking on:  07/05/2016   Potassium Chloride ER 20 MEQ Tbcr Take 20 mEq by mouth daily.   spironolactone 25 MG tablet Commonly known as:  ALDACTONE TAKE 1 TABLET BY MOUTH ONCE DAILY   traMADol 50 MG tablet Commonly known as:  ULTRAM Take 50 mg by mouth every 6 (six) hours as needed for moderate pain.       Follow-up Information    Alwyn Ren, MD. Schedule an appointment as soon as possible for a visit in 1 week(s).   Specialty:  Internal Medicine Why:  and your cardiologist within a week Contact information: [facility] [place] [address] [phone]           Major procedures and Radiology Reports - PLEASE review detailed and final reports thoroughly  -         Dg Chest [facility]  Result Date: 07/02/2016 CLINICAL DATA:  Shortness of breath EXAM: PORTABLE CHEST 1 VIEW COMPARISON:  Chest radiograph 06/07/2016 FINDINGS: The cardiac silhouette remains massively enlarged. Bibasilar opacities are unchanged. No osseous abnormality. IMPRESSION: Unchanged massive cardiomegaly and bibasilar opacities. Electronically Signed    By: Ulyses Jarred M.D.   On: 07/02/2016 02:57    Micro Results     Recent Results (from the past 240 hour(s))  MRSA PCR Screening     Status: None   Collection Time: 07/02/16  5:03 AM  Result Value Ref Range Status   MRSA by PCR NEGATIVE NEGATIVE Final    Comment:        The GeneXpert MRSA Assay (FDA approved for NASAL specimens only), is one component of a comprehensive MRSA colonization surveillance program. It is not intended to diagnose MRSA infection nor to guide or monitor treatment for MRSA infections.     Today   Subjective    [name] today has no headache,no chest abdominal pain,no new weakness tingling or numbness, feels much better wants to go home today.    Objective   Blood pressure (!) 114/58, pulse 91, temperature 98.6 F (37 C), temperature source Oral, resp. rate (!) 22, height 5\' 4"  (1.626 m), weight 100.5 kg (221 lb 9.6 oz), SpO2 98 %.   Intake/Output Summary (Last 24 hours) at 07/04/16 1131 Last data filed at 07/04/16 0952  Gross per 24 hour  Intake             1434 ml  Output             5200 ml  Net            -3766 ml    Exam Awake Alert, Oriented x 3, No new F.N deficits, Normal affect [place].AT,PERRAL Supple Neck,No JVD, No cervical lymphadenopathy appriciated.  Symmetrical Chest wall movement, Good air movement bilaterally, CTAB RRR,No Gallops,Rubs or new Murmurs, No Parasternal Heave +ve B.Sounds, Abd Soft, Non tender, No organomegaly appriciated, No rebound -guarding or rigidity. No Cyanosis, Clubbing or edema, No new Rash or bruise   Data Review   CBC w Diff: Lab Results  Component Value Date   WBC 4.5 07/04/2016   HGB 11.2 (L) 07/04/2016   HCT 37.1 07/04/2016   PLT 198 07/04/2016   LYMPHOPCT 13 10/14/2014   MONOPCT 10 10/14/2014   EOSPCT 2 10/14/2014   BASOPCT 0 10/14/2014    CMP: Lab Results  Component Value Date   NA 141 07/04/2016   K 3.7 07/04/2016   CL 97 (L) 07/04/2016   CO2 35 (H) 07/04/2016   BUN 21  (H) 07/04/2016   CREATININE 1.48 (H) 07/04/2016   PROT 6.4 (L) 07/02/2016   ALBUMIN 3.7 07/02/2016   BILITOT 0.8 07/02/2016   ALKPHOS 67 07/02/2016   AST 30 07/02/2016   ALT 23 07/02/2016  .   Total Time in preparing paper work, data evaluation and todays exam - 35 minutes  [name] M.D on 07/04/2016 at 11:31 AM  [facility]   Office  [phone]

## 2016-07-04 NOTE — Discharge Instructions (Signed)
Follow with Primary MD in 7 days   Get CBC, CMP, 2 view Chest X ray checked  by Primary MD or SNF MD in 5-7 days ( we routinely change or add medications that can affect your baseline labs and fluid status, therefore we recommend that you get the mentioned basic workup next visit with your PCP, your PCP may decide not to get them or add new tests based on their clinical decision)  Activity: As tolerated with Full fall precautions use walker/cane & assistance as needed  Disposition Home   Diet:   Diet Heart Healthy, Fluid restriction: 1500 mL Fluid/day.  For Heart failure patients - Check your Weight same time everyday, if you gain over 2 pounds, or you develop in leg swelling, experience more shortness of breath or chest pain, call your Primary MD immediately. Follow Cardiac Low Salt Diet and 1.5 lit/day fluid restriction.  On your next visit with your primary care physician please Get Medicines reviewed and adjusted.  Please request your Prim.MD to go over all Hospital Tests and Procedure/Radiological results at the follow up, please get all Hospital records sent to your Prim MD by signing hospital release before you go home.  If you experience worsening of your admission symptoms, develop shortness of breath, life threatening emergency, suicidal or homicidal thoughts you must seek medical attention immediately by calling 911 or calling your MD immediately  if symptoms less severe.  You Must read complete instructions/literature along with all the possible adverse reactions/side effects for all the Medicines you take and that have been prescribed to you. Take any new Medicines after you have completely understood and accpet all the possible adverse reactions/side effects.   Do not drive, operate heavy machinery, perform activities at heights, swimming or participation in water activities or provide baby sitting services if your were admitted for syncope or siezures until you have seen by Primary  MD or a Neurologist and advised to do so again.  Do not drive when taking Pain medications.    Do not take more than prescribed Pain, Sleep and Anxiety Medications  Special Instructions: If you have smoked or chewed Tobacco  in the last 2 yrs please stop smoking, stop any regular Alcohol  and or any Recreational drug use.  Wear Seat belts while driving.   Please note  You were cared for by a hospitalist during your hospital stay. If you have any questions about your discharge medications or the care you received while you were in the hospital after you are discharged, you can call the unit and asked to speak with the hospitalist on call if the hospitalist that took care of you is not available. Once you are discharged, your primary care physician will handle any further medical issues. Please note that NO REFILLS for any discharge medications will be authorized once you are discharged, as it is imperative that you return to your primary care physician (or establish a relationship with a primary care physician if you do not have one) for your aftercare needs so that they can reassess your need for medications and monitor your lab values.                                                       [name] was admitted to the Hospital on 07/02/2016 and Discharged  07/04/2016 and should be excused from work/school   for 5 days starting from date -  07/02/2016 , may return to work/school without any restrictions.  Call Sarah Lund MD, [facility]  [phone] with questions.  [name] M.D on 07/04/2016,at 11:28 AM  [facility]   Office  [phone]

## 2020-04-29 ENCOUNTER — Emergency Department (HOSPITAL_COMMUNITY)
Admission: EM | Admit: 2020-04-29 | Discharge: 2020-04-30 | Disposition: A | Discharge status: Home / Self Care | Payer: 59 | Attending: Emergency Medicine | Admitting: Emergency Medicine

## 2020-04-29 ENCOUNTER — Encounter (HOSPITAL_COMMUNITY): Payer: Self-pay | Admitting: Emergency Medicine

## 2020-04-29 ENCOUNTER — Emergency Department (HOSPITAL_COMMUNITY): Payer: 59

## 2020-04-29 DIAGNOSIS — J449 Chronic obstructive pulmonary disease, unspecified: Secondary | ICD-10-CM | POA: Insufficient documentation

## 2020-04-29 DIAGNOSIS — I130 Hypertensive heart and chronic kidney disease with heart failure and stage 1 through stage 4 chronic kidney disease, or unspecified chronic kidney disease: Secondary | ICD-10-CM | POA: Diagnosis not present

## 2020-04-29 DIAGNOSIS — Z8673 Personal history of transient ischemic attack (TIA), and cerebral infarction without residual deficits: Secondary | ICD-10-CM | POA: Insufficient documentation

## 2020-04-29 DIAGNOSIS — R0602 Shortness of breath: Secondary | ICD-10-CM | POA: Insufficient documentation

## 2020-04-29 DIAGNOSIS — I5022 Chronic systolic (congestive) heart failure: Secondary | ICD-10-CM

## 2020-04-29 DIAGNOSIS — I5043 Acute on chronic combined systolic (congestive) and diastolic (congestive) heart failure: Secondary | ICD-10-CM | POA: Diagnosis not present

## 2020-04-29 DIAGNOSIS — Z20822 Contact with and (suspected) exposure to covid-19: Secondary | ICD-10-CM | POA: Insufficient documentation

## 2020-04-29 DIAGNOSIS — Z7982 Long term (current) use of aspirin: Secondary | ICD-10-CM | POA: Diagnosis not present

## 2020-04-29 DIAGNOSIS — Z79899 Other long term (current) drug therapy: Secondary | ICD-10-CM | POA: Diagnosis not present

## 2020-04-29 DIAGNOSIS — R7989 Other specified abnormal findings of blood chemistry: Secondary | ICD-10-CM | POA: Insufficient documentation

## 2020-04-29 DIAGNOSIS — N183 Chronic kidney disease, stage 3 unspecified: Secondary | ICD-10-CM | POA: Insufficient documentation

## 2020-04-29 DIAGNOSIS — F17210 Nicotine dependence, cigarettes, uncomplicated: Secondary | ICD-10-CM | POA: Insufficient documentation

## 2020-04-29 LAB — BASIC METABOLIC PANEL
Anion gap: 11 (ref 5–15)
BUN: 25 mg/dL — ABNORMAL HIGH (ref 6–20)
CO2: 33 mmol/L — ABNORMAL HIGH (ref 22–32)
Calcium: 8.9 mg/dL (ref 8.9–10.3)
Chloride: 97 mmol/L — ABNORMAL LOW (ref 98–111)
Creatinine, Ser: 1.4 mg/dL — ABNORMAL HIGH (ref 0.44–1.00)
GFR, Estimated: 46 mL/min — ABNORMAL LOW (ref >60)
Glucose, Bld: 74 mg/dL (ref 70–99)
Potassium: 3.9 mmol/L (ref 3.5–5.1)
Sodium: 141 mmol/L (ref 135–145)

## 2020-04-29 LAB — HEPATIC FUNCTION PANEL
ALT: 67 U/L — ABNORMAL HIGH (ref 0–44)
AST: 34 U/L (ref 15–41)
Albumin: 3.5 g/dL (ref 3.5–5.0)
Alkaline Phosphatase: 67 U/L (ref 38–126)
Bilirubin, Direct: 0.2 mg/dL (ref 0.0–0.2)
Indirect Bilirubin: 0.7 mg/dL (ref 0.3–0.9)
Total Bilirubin: 0.9 mg/dL (ref 0.3–1.2)
Total Protein: 6.2 g/dL — ABNORMAL LOW (ref 6.5–8.1)

## 2020-04-29 LAB — CBC
HCT: 36.8 % (ref 36.0–46.0)
Hemoglobin: 11.4 g/dL — ABNORMAL LOW (ref 12.0–15.0)
MCH: 28.5 pg (ref 26.0–34.0)
MCHC: 31 g/dL (ref 30.0–36.0)
MCV: 92 fL (ref 80.0–100.0)
Platelets: 159 10*3/uL (ref 150–400)
RBC: 4 MIL/uL (ref 3.87–5.11)
RDW: 14.6 % (ref 11.5–15.5)
WBC: 6.9 10*3/uL (ref 4.0–10.5)
nRBC: 0 % (ref 0.0–0.2)

## 2020-04-29 LAB — BRAIN NATRIURETIC PEPTIDE: B Natriuretic Peptide: 1060.2 pg/mL — ABNORMAL HIGH (ref 0.0–100.0)

## 2020-04-29 MED ORDER — SPIRONOLACTONE 25 MG PO TABS
25.0000 mg | ORAL_TABLET | Freq: Every day | ORAL | Status: DC
  Administered 2020-04-30: 25 mg via ORAL
  Filled 2020-04-29: qty 1

## 2020-04-29 MED ORDER — RIVAROXABAN 15 MG PO TABS
15.0000 mg | ORAL_TABLET | Freq: Once | ORAL | Status: AC
  Administered 2020-04-30: 15 mg via ORAL
  Filled 2020-04-29: qty 1

## 2020-04-29 MED ORDER — FUROSEMIDE 20 MG PO TABS
40.0000 mg | ORAL_TABLET | Freq: Once | ORAL | Status: AC
  Administered 2020-04-29: 40 mg via ORAL
  Filled 2020-04-29: qty 2

## 2020-04-29 MED ORDER — SACUBITRIL-VALSARTAN 24-26 MG PO TABS
1.0000 | ORAL_TABLET | Freq: Two times a day (BID) | ORAL | Status: DC
  Administered 2020-04-30 (×2): 1 via ORAL
  Filled 2020-04-29 (×3): qty 1

## 2020-04-29 MED ORDER — CARVEDILOL 3.125 MG PO TABS
12.5000 mg | ORAL_TABLET | Freq: Two times a day (BID) | ORAL | Status: DC
  Administered 2020-04-30: 12.5 mg via ORAL
  Filled 2020-04-29: qty 4

## 2020-04-29 MED ORDER — TRAZODONE HCL 100 MG PO TABS
100.0000 mg | ORAL_TABLET | Freq: Every evening | ORAL | Status: DC | PRN
  Administered 2020-04-30: 100 mg via ORAL
  Filled 2020-04-29: qty 1

## 2020-04-29 MED ORDER — HYDRALAZINE HCL 10 MG PO TABS
40.0000 mg | ORAL_TABLET | Freq: Three times a day (TID) | ORAL | Status: DC
  Administered 2020-04-29 – 2020-04-30 (×3): 40 mg via ORAL
  Filled 2020-04-29 (×3): qty 4

## 2020-04-29 MED ORDER — AMIODARONE HCL 200 MG PO TABS
200.0000 mg | ORAL_TABLET | Freq: Three times a day (TID) | ORAL | Status: DC
  Administered 2020-04-29 – 2020-04-30 (×3): 200 mg via ORAL
  Filled 2020-04-29 (×3): qty 1

## 2020-04-29 MED ORDER — FAMOTIDINE 20 MG PO TABS
40.0000 mg | ORAL_TABLET | Freq: Two times a day (BID) | ORAL | Status: DC
  Administered 2020-04-29 – 2020-04-30 (×2): 40 mg via ORAL
  Filled 2020-04-29 (×3): qty 2

## 2020-04-29 NOTE — ED Notes (Signed)
Due to O2 pt placed back on nasal canula @ 3 ltrs

## 2020-04-29 NOTE — Care Management (Addendum)
ED CM reviewed record, and noted patient  was went home from [facility] home yesterday on home oxygen. She was discharged home on  [facility] patient states she was sent home with 3 tanks from the hosptial  and the concentrator was to be delivered to her home today. Patient tells me that  she was at the doctor's office when they attempted to deliver.  The DME company is [employer], this CM attempted to contact the office but no answer tonight. She also states that she did not pick up all of her meds from [facility]  Because they were not ready. CM cannot assist with obtaining oxygen tonight it would have to be followed up in the morning. Patient states she can pick up her meds in the morning. CM will update EDP. ED TOC team will follow up in the am

## 2020-04-29 NOTE — ED Triage Notes (Signed)
Patient BIB [facility] for shortness of breath, history of CHF. Was dishcarged from [facility] 7 days ago and was at PCP for follow up, was found to have spO2 of 79%. Patient was supposed to have had home O2 delivered after admission but still has not received it. Placed on 3L [place], SpO2 96%. Patient also reports weight gain of 4 pounds since discharge.

## 2020-04-29 NOTE — ED Provider Notes (Signed)
[facility] EMERGENCY DEPARTMENT Provider Note   CSN: [id number] Arrival date & time: 04/29/20  1318     History Chief Complaint  Patient presents with  . Shortness of Breath    [name] is a 50 y.o. female.  The history is provided by the patient.  Shortness of Breath Severity:  Moderate Onset quality:  Gradual Duration:  1 day Timing:  Constant Progression:  Waxing and waning Chronicity:  Recurrent Relieved by:  Nothing Worsened by:  Nothing Ineffective treatments:  None tried Associated symptoms: no abdominal pain, no chest pain, no cough, no ear pain, no fever, no rash, no sore throat and no vomiting        Past Medical History:  Diagnosis Date  . Chronic systolic CHF   . HTN (hypertension)   . Non-ischemic cardiomyopathy ([place])   . Stroke ([place])    2010  . Tobacco abuse     Patient Active Problem List   Diagnosis Date Noted  . CHF exacerbation ([place]) 07/02/2016  . Accelerated hypertension 07/02/2016  . Bilateral lower extremity edema 07/02/2016  . Abdominal pain 07/02/2016  . Noncompliance 07/08/2014  . Chronic systolic heart failure ([place]) 06/26/2014  . Pleuritic chest pain   . Acute systolic CHF (congestive heart failure) ([place])   . Acute kidney injury ([place])   . Tachycardia 06/09/2014  . Acute respiratory failure with hypoxia ([place]) 06/09/2014  . Acute on chronic combined systolic and diastolic heart failure ([place]) 06/09/2014  . CKD (chronic kidney disease) stage 3, GFR 30-59 ml/min (HCC) 06/09/2014  . Leukocytosis 06/09/2014  . Anemia of chronic disease 06/09/2014  . Dyslipidemia 06/09/2014  . Troponin level elevated 06/09/2014  . TOBACCO ABUSE 09/25/2008  . HYPERTENSION, BENIGN ESSENTIAL 09/25/2008  . SLEEP APNEA 09/25/2008  . CVA 05/18/2008    Past Surgical History:  Procedure Laterality Date  . CARDIAC CATHETERIZATION     2012  . CAROTID STENT INSERTION     2010  . CESAREAN SECTION       OB History   No obstetric  history on file.     Family History  Problem Relation Age of Onset  . CVA Mother     Social History   Tobacco Use  . Smoking status: Light Tobacco Smoker    Packs/day: 0.30    Years: 12.00    Pack years: 3.60    Types: Cigarettes  . Smokeless tobacco: Former Systems developer    Quit date: 06/24/2014  Substance Use Topics  . Alcohol use: Yes    Alcohol/week: 0.0 standard drinks    Comment: rarely    Home Medications Prior to Admission medications   Medication Sig Start Date End Date Taking? Authorizing Provider  acetaminophen (TYLENOL) 325 MG tablet Take 2 tablets (650 mg total) by mouth every 6 (six) hours as needed for mild pain, moderate pain, fever or headache (or Fever >/= 101). 06/12/14   Hongalgi, Lenis Dickinson, MD  albuterol (PROVENTIL) (2.5 MG/3ML) 0.083% nebulizer solution Take 2.5 mg by nebulization every 6 (six) hours as needed for wheezing or shortness of breath.    [provider]  aspirin 81 MG chewable tablet Chew 1 tablet (81 mg total) by mouth daily. 07/05/16   [name], MD  atorvastatin (LIPITOR) 20 MG tablet TAKE 1 TABLET BY MOUTH ONCE DAILY Patient taking differently: TAKE 2 TABLET BY MOUTH ONCE DAILY 04/22/15   Clegg, Amy D, NP  carvedilol (COREG) 25 MG tablet Take 1 tablet (25 mg total) by mouth  2 (two) times daily with a meal. 02/25/15   Clegg, Amy D, NP  furosemide (LASIX) 20 MG tablet Take 3 tablets (60 mg total) by mouth 2 (two) times daily. 07/04/16   [name], MD  hydrALAZINE (APRESOLINE) 100 MG tablet Take 1 tablet (100 mg total) by mouth every 8 (eight) hours. 07/04/16   [name], MD  isosorbide mononitrate (IMDUR) 30 MG 24 hr tablet Take 1 tablet (30 mg total) by mouth daily. 07/05/16   [name], MD  potassium chloride 20 MEQ TBCR Take 20 mEq by mouth daily. 07/04/16   [name], MD  spironolactone (ALDACTONE) 25 MG tablet TAKE 1 TABLET BY MOUTH ONCE DAILY 04/22/15   Clegg, Amy D, NP  traMADol (ULTRAM) 50 MG tablet Take 50  mg by mouth every 6 (six) hours as needed for moderate pain.    [provider]    Allergies    Sulfonamide derivatives  Review of Systems   Review of Systems  Constitutional: Negative for chills and fever.  HENT: Negative for ear pain and sore throat.   Eyes: Negative for pain and visual disturbance.  Respiratory: Positive for shortness of breath. Negative for cough.   Cardiovascular: Negative for chest pain and palpitations.  Gastrointestinal: Negative for abdominal pain and vomiting.  Genitourinary: Negative for dysuria and hematuria.  Musculoskeletal: Negative for arthralgias and back pain.  Skin: Negative for color change and rash.  Neurological: Negative for seizures and syncope.  All other systems reviewed and are negative.   Physical Exam Updated Vital Signs BP (!) 153/63 (BP Location: Right Arm)   Pulse 74   Temp 98.1 F (36.7 C) (Oral)   Resp 16   Ht 5\' 4"  (1.626 m)   Wt 113.4 kg   SpO2 99%   BMI 42.91 kg/m   Physical Exam Vitals and nursing note reviewed.  Constitutional:      General: She is not in acute distress.    Appearance: She is well-developed and well-nourished. She is obese.  HENT:     Head: Normocephalic and atraumatic.  Eyes:     Conjunctiva/sclera: Conjunctivae normal.  Cardiovascular:     Rate and Rhythm: Normal rate and regular rhythm.     Heart sounds: No murmur heard.     Comments: Trace pitting edema BLE Pulmonary:     Effort: Pulmonary effort is normal. No respiratory distress.     Breath sounds: Examination of the right-lower field reveals rales. Examination of the left-lower field reveals rales. Rales present.  Abdominal:     Palpations: Abdomen is soft.     Tenderness: There is no abdominal tenderness.  Musculoskeletal:        General: No edema.     Cervical back: Neck supple.  Skin:    General: Skin is warm and dry.  Neurological:     Mental Status: She is alert.  Psychiatric:        Mood and Affect: Mood and  affect normal.     ED Results / Procedures / Treatments   Labs (all labs ordered are listed, but only abnormal results are displayed) Labs Reviewed  BASIC METABOLIC PANEL - Abnormal; Notable for the following components:      Result Value   Chloride 97 (*)    CO2 33 (*)    BUN 25 (*)    Creatinine, Ser 1.40 (*)    GFR, Estimated 46 (*)    All other components within normal limits  CBC -  Abnormal; Notable for the following components:   Hemoglobin 11.4 (*)    All other components within normal limits    EKG None  Radiology DG Chest 2 View  Result Date: 04/29/2020 CLINICAL DATA:  Pt having SOB today - was released from hospital yesterday (was in for AFIB issues) - hx of stroke, smoker, CHF, htn - pt mentions she may have been exposed to someone with COVID EXAM: CHEST - 2 VIEW COMPARISON:  04/18/2020. FINDINGS: Moderate enlargement of the cardiopericardial silhouette stable from the prior exam. No mediastinal or hilar masses. No evidence of adenopathy. Lungs are clear. Cephalized pulmonary vasculature similar to the prior exam. No pleural effusion or pneumothorax. Skeletal structures are grossly intact. IMPRESSION: 1. No acute cardiopulmonary disease. 2. Cardiomegaly stable from the recent prior study. Electronically Signed   By: Lajean Manes M.D.   On: 04/29/2020 13:58    Procedures Procedures (including critical care time)  Medications Ordered in ED Medications - No data to display  ED Course  I have reviewed the triage vital signs and the nursing notes.  Pertinent labs & imaging results that were available during my care of the patient were reviewed by me and considered in my medical decision making (see chart for details).    MDM Rules/Calculators/A&P                          This is a 50 year old female with a past medical history of heart failure with reduced ejection fraction, EF 15%, dilated cardiomyopathy, hypertension, hyperlipidemia CKD 3, COPD, hepatitis C who  presented to the emergency department for evaluation of shortness of breath.  Patient was recently admitted to outside hospital for acute on chronic systolic heart failure and had multiple medication changes, was weaned from BiPAP to 4 L nasal cannula and was set up with home oxygen.  Unfortunately, the patient reports that nobody came home to set up her home oxygen she has not been able to pick up her medications that were called into [facility], she has gained 5 pounds over the last day,  Patient is hypoxic on room air but maintains her saturations greater than 94% when put on 3 to 4 L which is what she was felt to be discharged home on.  She appears to be slightly volume up, no significant JVD, she does have some bibasilar rales and is obese making some fluid status difficult to interpret.  Her BNP is elevated at 1060 which is down from her recent admission BNP of 2160.  Patient appears to be stable on what she was discharged with yesterday, I reached out to case management to see if we could get the patient set up for her home oxygen and help her with the medications which should be available at the [facility] as they were called in, unfortunately she was unable to arrange oxygen tonight and believe the patient can be set up tomorrow for her oxygen and also pick up her medications.  CBC shows hemoglobin at baseline, otherwise unremarkable.  Chemistry shows BUN 25 and a creatinine of 1.43, this appears to be baseline from her previous a few years ago, it is also noted that patient's creatinine was 1.7 on admission to outside hospital recently and was downtrending at time of discharge.  I reviewed the recent discharge summary that shows the patient was discharged and supposed to start taking START taking these medications  Details  amiodarone (PACERONE) 200 MG tablet Take 1 tablet (200  mg total) by mouth 3 times daily. Qty: 270 tablet, Refills: 0   ciprofloxacin HCl (CIPRO) 500 MG tablet Take 1 tablet  (500 mg total) by mouth 2 times daily for 4 days. Qty: 8 tablet, Refills: 0   famotidine (PEPCID) 40 MG tablet Take 1 tablet (40 mg total) by mouth 2 times daily for 30 days. Qty: 60 tablet, Refills: 0   rivaroxaban (XARELTO) 15 mg tablet *ANTICOAGULANT* Take 1 tablet (15 mg total) by mouth daily with dinner for 30 days. Qty: 30 tablet, Refills: 0   sacubitriL-valsartan (ENTRESTO) 24-26 mg per tablet Take 2 tablets by mouth 2 times daily. Qty: 180 tablet, Refills: 3    CONTINUE these medications which have CHANGED  Details  carvediloL (COREG) 12.5 MG tablet Take 1 tablet (12.5 mg total) by mouth in the morning and at bedtime for 30 days. Qty: 60 tablet, Refills: 0   furosemide (LASIX) 40 MG tablet Take 1 tablet (40 mg total) by mouth daily. Qty: 90 tablet, Refills: 0   magnesium oxide (MAG-OX) 400 mg (241.3 mg magnesium) tablet Take 1 tablet (400 mg total) by mouth 2 times daily for 10 days. Qty: 20 tablet, Refills: 0    CONTINUE these medications which have NOT CHANGED  Details  ANORO ELLIPTA 62.5-25 mcg/actuation inhaler INHALE 1 PUFF INTO THE LUNGS DAILY Qty: 60 each, Refills: 1  Associated Diagnoses: COPD exacerbation (HCC)   atorvastatin (LIPITOR) 20 MG tablet TAKE 1 TABLET(20 MG) BY MOUTH EVERY NIGHT Qty: 90 tablet, Refills: 0   cyanocobalamin (VITAMIN B12) 100 MCG tablet TAKE 1 TABLET BY MOUTH DAILY Qty: 90 tablet, Refills: 3   FEROSUL 325 mg (65 mg iron) tablet TAKE 1 TABLET BY MOUTH DAILY WITH BREAKFAST Qty: 90 tablet, Refills: 0   hydrALAZINE (APRESOLINE) 10 MG tablet Take 40 mg by mouth 3 times daily.   sertraline (ZOLOFT) 100 MG tablet Take 1 tab at night Qty: 90 tablet, Refills: 1   spironolactone (ALDACTONE) 25 MG tablet TAKE 1 TABLET BY MOUTH DAILY Qty: 90 tablet, Refills: 3   acetaminophen (TYLENOL) 325 MG tablet Take 650 mg by mouth every 6 (six) hours as needed for Pain.   albuterol 90 mcg/actuation inhaler Inhale 2 puffs into the lungs every 6  (six) hours as needed for Wheezing. Qty: 1 Inhaler, Refills: 3  Associated Diagnoses: Chronic obstructive pulmonary disease, unspecified COPD type (HCC)   aspirin 325 MG EC tablet *ANTIPLATELET* Take 325 mg by mouth daily as needed for Pain.   aspirin-caffeine 1,000-65 mg PwPk Take 1 packet by mouth daily as needed (pain).   ibuprofen (ADVIL,MOTRIN) 200 MG tablet Take 400 mg by mouth every 6 (six) hours as needed for Pain.   nicotine (NICODERM CQ) 14 mg/24 hr Place 1 patch onto the skin daily. Qty: 42 patch, Refills: 0   traZODone (DESYREL) 100 MG tablet Take 100 mg by mouth nightly as needed for Sleep.    STOP taking these medications   sacubitriL-valsartan (ENTRESTO) 49-51 mg per tablet   acyclovir (ZOVIRAX) 800 MG tablet   acyclovir 5 % cream   azithromycin (ZITHROMAX) 250 MG tablet   digoxin (LANOXIN) 0.125 MG tablet   metroNIDAZOLE (FLAGYL) 500 MG tablet    . These medications were ordered as the patient will be in the emergency department overnight, case management will proceed tomorrow morning to get the patient set up with home oxygen and to start the medications as directed above.  Patient does not have any increasing oxygen requirements, BNP appears to  be downtrending, do not believe admission is warranted at this time.  Final Clinical Impression(s) / ED Diagnoses Final diagnoses:  None    Rx / DC Orders ED Discharge Orders    None       Camila Li, MD 04/29/20 [place], [place], MD 04/29/20 2359

## 2020-04-30 LAB — RESP PANEL BY RT-PCR (FLU A&B, COVID) ARPGX2
Influenza A by PCR: NEGATIVE
Influenza B by PCR: NEGATIVE
SARS Coronavirus 2 by RT PCR: NEGATIVE

## 2020-04-30 NOTE — ED Provider Notes (Signed)
Family members on the phone reporting the oxygen company is at the house awaiting patient's arrival to set up her home O2.  Per medical record review it appears she was boarding overnight awaiting home O2, and this is now available.  Family coming to pick her up.  Her prescriptions were already sent to her pharmacy.   Wyvonnia Dusky, MD 04/30/20 1327

## 2020-04-30 NOTE — ED Notes (Signed)
Pts sister called to say [employer] at the home to set up oxygen, needs patient there to do the set up. MD notified, D/C papers being completed. Sister finding a ride at this time.

## 2020-04-30 NOTE — ED Notes (Signed)
Bubble study being done

## 2020-06-15 DEATH — deceased
